# Patient Record
Sex: Female | Born: 1955 | Race: White | Hispanic: No | Marital: Married | Smoking: Never smoker
Health system: Southern US, Community
[De-identification: ages and names within clinical notes are randomized; demographics above are authoritative.]

## PROBLEM LIST (undated history)

## (undated) DIAGNOSIS — K589 Irritable bowel syndrome without diarrhea: Secondary | ICD-10-CM

## (undated) DIAGNOSIS — M810 Age-related osteoporosis without current pathological fracture: Secondary | ICD-10-CM

## (undated) DIAGNOSIS — I341 Nonrheumatic mitral (valve) prolapse: Secondary | ICD-10-CM

## (undated) HISTORY — DX: Irritable bowel syndrome, unspecified: K58.9

## (undated) HISTORY — DX: Age-related osteoporosis without current pathological fracture: M81.0

## (undated) HISTORY — PX: BREAST CYST ASPIRATION: SHX578

---

## 2002-04-18 ENCOUNTER — Ambulatory Visit (HOSPITAL_COMMUNITY): Admission: RE | Admit: 2002-04-18 | Discharge: 2002-04-18 | Payer: Self-pay | Admitting: *Deleted

## 2003-02-23 HISTORY — PX: ABDOMINAL HYSTERECTOMY: SHX81

## 2004-02-29 ENCOUNTER — Emergency Department: Payer: Self-pay | Admitting: Emergency Medicine

## 2004-03-04 ENCOUNTER — Emergency Department: Payer: Self-pay | Admitting: Emergency Medicine

## 2004-03-12 ENCOUNTER — Emergency Department: Payer: Self-pay | Admitting: Emergency Medicine

## 2004-10-07 ENCOUNTER — Ambulatory Visit (HOSPITAL_COMMUNITY): Admission: RE | Admit: 2004-10-07 | Discharge: 2004-10-07 | Payer: Self-pay | Admitting: *Deleted

## 2005-07-29 ENCOUNTER — Ambulatory Visit: Payer: Self-pay | Admitting: Unknown Physician Specialty

## 2005-08-31 ENCOUNTER — Ambulatory Visit: Payer: Self-pay | Admitting: Family Medicine

## 2006-08-18 ENCOUNTER — Ambulatory Visit: Payer: Self-pay | Admitting: Unknown Physician Specialty

## 2007-11-08 ENCOUNTER — Ambulatory Visit: Payer: Self-pay | Admitting: Obstetrics and Gynecology

## 2009-03-18 ENCOUNTER — Ambulatory Visit: Payer: Self-pay | Admitting: Unknown Physician Specialty

## 2010-09-29 ENCOUNTER — Ambulatory Visit: Payer: Self-pay | Admitting: Unknown Physician Specialty

## 2013-04-24 ENCOUNTER — Encounter: Payer: Self-pay | Admitting: General Surgery

## 2013-05-02 ENCOUNTER — Encounter: Payer: Self-pay | Admitting: General Surgery

## 2013-05-02 ENCOUNTER — Ambulatory Visit (INDEPENDENT_AMBULATORY_CARE_PROVIDER_SITE_OTHER): Payer: BC Managed Care – PPO | Admitting: General Surgery

## 2013-05-02 VITALS — BP 122/70 | HR 60 | Resp 12 | Ht 67.0 in | Wt 121.0 lb

## 2013-05-02 DIAGNOSIS — N644 Mastodynia: Secondary | ICD-10-CM

## 2013-05-02 NOTE — Patient Instructions (Signed)
Patient asked to take 2 Aleve by mouth twice daily for 2 weeks. Patient to call with a report to let us know if it helped with the pain. The patient is aware to call back for any questions or concerns. Patient to return as needed.

## 2013-05-02 NOTE — Progress Notes (Signed)
Patient ID: Megan Tapia, female   DOB: 07/31/1955, 58 y.o.   MRN: [id number]  Chief Complaint  Patient presents with  . Follow-up    mammogram    HPI Megan Tapia is a 58 y.o. female who presents for a breast evaluation. The most recent mammogram was done on 01/17/13. Patient does perform regular self breast checks and gets regular mammograms done.  The patient complains of left breast pain that's described as an ache. The pain is constant. She does not use anything to help alleviate the pain. No injuries have occurred in this area.  This started around October 2014. The pain radiates from the nipple to her back. It does not stop her from what she needs to do. No thing makes the pain better or worse. The patient is right-handed, and does not carry a handbag, only a clutch. No history of extreme physical activity/exercise regimen. She is made uses p.r.n. Advil on occasion.  The patient was last seen in this office in February 2009 in regards to mastalgia involving the upper lateral aspect of the left breast. Similar pain had been noted in 2006. In 2000, multiple breast cysts were identified clinical exam and confirmed on office ultrasound.    HPI  Past Medical History  Diagnosis Date  . Osteoporosis   . IBS (irritable bowel syndrome)     Past Surgical History  Procedure Laterality Date  . Abdominal hysterectomy  2005    Family History  Problem Relation Age of Onset  . Cancer Maternal Aunt 5750    breast    Social History History  Substance Use Topics  . Smoking status: Never Smoker   . Smokeless tobacco: Never Used  . Alcohol Use: No    Allergies  Allergen Reactions  . Boniva [Ibandronic Acid] Nausea Only    Current Outpatient Prescriptions  Medication Sig Dispense Refill  . alendronate (FOSAMAX) 70 MG tablet Take 70 mg by mouth once a week. Take with a full glass of water on an empty stomach.      . Calcium Carbonate-Vitamin D (CALCIUM + D PO) Take 2 tablets by  mouth daily.      Marland Kitchen. VITAMIN D, CHOLECALCIFEROL, PO Take 1 tablet by mouth daily.       No current facility-administered medications for this visit.    Review of Systems Review of Systems  Constitutional: Negative.   Respiratory: Negative.   Cardiovascular: Negative.     Blood pressure 122/70, pulse 60, resp. rate 12, height 5\' 7"  (1.702 m), weight 121 lb (54.885 kg).  Physical Exam Physical Exam  Constitutional: She is oriented to person, place, and time. She appears well-developed and well-nourished.  Neck: Neck supple. No thyromegaly present.  Cardiovascular: Normal rate, regular rhythm and normal heart sounds.   No murmur heard. Pulmonary/Chest: Effort normal and breath sounds normal. She exhibits no tenderness, no bony tenderness, no edema, no deformity and no swelling. Right breast exhibits no inverted nipple, no mass, no nipple discharge, no skin change and no tenderness. Left breast exhibits no inverted nipple, no mass, no nipple discharge, no skin change and no tenderness.  Musculoskeletal: Normal range of motion.       Left shoulder: Normal.       Arms: Lymphadenopathy:    She has no cervical adenopathy.    She has no axillary adenopathy.  Neurological: She is alert and oriented to person, place, and time.  Skin: Skin is warm and dry.    Data Reviewed Bilateral  mammogram dated January 17, 2013 completed at [facility] reported dense breasts with stable punctate calcifications. BI-RAD-2  Assessment    .Mastalgia, recurrent.     Plan    The patient has been asked to make use of a trial of Aleve, 2 tablets b.i.d. For 2 weeks and then to give a phone followup.       Earline Mayotte 05/05/2013, 8:11 PM   [facility], M.D.

## 2013-05-05 DIAGNOSIS — N644 Mastodynia: Secondary | ICD-10-CM | POA: Insufficient documentation

## 2013-12-24 ENCOUNTER — Encounter: Payer: Self-pay | Admitting: General Surgery

## 2014-08-15 ENCOUNTER — Other Ambulatory Visit: Payer: Self-pay | Admitting: Unknown Physician Specialty

## 2014-08-15 DIAGNOSIS — Z1231 Encounter for screening mammogram for malignant neoplasm of breast: Secondary | ICD-10-CM

## 2014-08-16 ENCOUNTER — Other Ambulatory Visit: Payer: Self-pay | Admitting: Unknown Physician Specialty

## 2014-08-16 ENCOUNTER — Ambulatory Visit
Admission: RE | Admit: 2014-08-16 | Discharge: 2014-08-16 | Disposition: A | Discharge status: Home / Self Care | Payer: BC Managed Care – PPO | Source: Ambulatory Visit | Attending: Unknown Physician Specialty | Admitting: Unknown Physician Specialty

## 2014-08-16 DIAGNOSIS — Z1231 Encounter for screening mammogram for malignant neoplasm of breast: Secondary | ICD-10-CM | POA: Diagnosis present

## 2015-07-23 ENCOUNTER — Emergency Department: Payer: BC Managed Care – PPO

## 2015-07-23 ENCOUNTER — Emergency Department
Admission: EM | Admit: 2015-07-23 | Discharge: 2015-07-24 | Disposition: A | Discharge status: Home / Self Care | Payer: BC Managed Care – PPO | Attending: Emergency Medicine | Admitting: Emergency Medicine

## 2015-07-23 DIAGNOSIS — R110 Nausea: Secondary | ICD-10-CM | POA: Diagnosis not present

## 2015-07-23 DIAGNOSIS — R519 Headache, unspecified: Secondary | ICD-10-CM

## 2015-07-23 DIAGNOSIS — R51 Headache: Secondary | ICD-10-CM | POA: Diagnosis present

## 2015-07-23 HISTORY — DX: Nonrheumatic mitral (valve) prolapse: I34.1

## 2015-07-23 LAB — CBC WITH DIFFERENTIAL/PLATELET
Basophils Absolute: 0 10*3/uL (ref 0–0.1)
EOS ABS: 0 10*3/uL (ref 0–0.7)
HCT: 41.4 % (ref 35.0–47.0)
Hemoglobin: 14.2 g/dL (ref 12.0–16.0)
LYMPHS ABS: 1.1 10*3/uL (ref 1.0–3.6)
Lymphocytes Relative: 16 %
MCH: 30 pg (ref 26.0–34.0)
MCHC: 34.2 g/dL (ref 32.0–36.0)
MCV: 87.7 fL (ref 80.0–100.0)
MONO ABS: 0.3 10*3/uL (ref 0.2–0.9)
Neutro Abs: 5.5 10*3/uL (ref 1.4–6.5)
Neutrophils Relative %: 78 %
PLATELETS: 217 10*3/uL (ref 150–440)
RBC: 4.72 MIL/uL (ref 3.80–5.20)
RDW: 13.1 % (ref 11.5–14.5)
WBC: 6.9 10*3/uL (ref 3.6–11.0)

## 2015-07-23 LAB — MONONUCLEOSIS SCREEN: Mono Screen: NEGATIVE

## 2015-07-23 LAB — URINALYSIS COMPLETE WITH MICROSCOPIC (ARMC ONLY)
BACTERIA UA: NONE SEEN
BILIRUBIN URINE: NEGATIVE
Glucose, UA: 150 mg/dL — AB
HGB URINE DIPSTICK: NEGATIVE
LEUKOCYTES UA: NEGATIVE
NITRITE: NEGATIVE
PH: 8 (ref 5.0–8.0)
Protein, ur: 30 mg/dL — AB
Specific Gravity, Urine: 1.012 (ref 1.005–1.030)
Squamous Epithelial / LPF: NONE SEEN

## 2015-07-23 LAB — COMPREHENSIVE METABOLIC PANEL
ALK PHOS: 49 U/L (ref 38–126)
ALT: 25 U/L (ref 14–54)
ANION GAP: 16 — AB (ref 5–15)
AST: 43 U/L — ABNORMAL HIGH (ref 15–41)
Albumin: 4.7 g/dL (ref 3.5–5.0)
BUN: 11 mg/dL (ref 6–20)
CALCIUM: 9.4 mg/dL (ref 8.9–10.3)
CO2: 17 mmol/L — AB (ref 22–32)
Chloride: 102 mmol/L (ref 101–111)
Creatinine, Ser: 0.82 mg/dL (ref 0.44–1.00)
GFR calc non Af Amer: >60 mL/min (ref >60)
Glucose, Bld: 171 mg/dL — ABNORMAL HIGH (ref 65–99)
POTASSIUM: 2.7 mmol/L — AB (ref 3.5–5.1)
SODIUM: 135 mmol/L (ref 135–145)
Total Bilirubin: 0.8 mg/dL (ref 0.3–1.2)
Total Protein: 7.5 g/dL (ref 6.5–8.1)

## 2015-07-23 MED ORDER — DOXYCYCLINE HYCLATE 100 MG PO TBEC: 100.0000 mg | DELAYED_RELEASE_TABLET | Freq: Two times a day (BID) | ORAL | Status: AC

## 2015-07-23 MED ORDER — HYDROMORPHONE HCL 1 MG/ML IJ SOLN: 1.0000 mg | Freq: Once | INTRAMUSCULAR | Status: DC

## 2015-07-23 MED ORDER — LORAZEPAM 2 MG/ML IJ SOLN
2.0000 mg | Freq: Once | INTRAMUSCULAR | Status: AC
  Administered 2015-07-23: 2 mg via INTRAVENOUS
  Filled 2015-07-23: qty 1

## 2015-07-23 MED ORDER — POTASSIUM CHLORIDE ER 20 MEQ PO TBCR: 20.0000 meq | EXTENDED_RELEASE_TABLET | Freq: Every day | ORAL | Status: DC

## 2015-07-23 MED ORDER — DOXYCYCLINE HYCLATE 100 MG PO TABS
100.0000 mg | ORAL_TABLET | Freq: Two times a day (BID) | ORAL | Status: DC
  Administered 2015-07-23: 100 mg via ORAL
  Filled 2015-07-23: qty 1

## 2015-07-23 MED ORDER — POTASSIUM CHLORIDE CRYS ER 20 MEQ PO TBCR
20.0000 meq | EXTENDED_RELEASE_TABLET | Freq: Once | ORAL | Status: AC
  Administered 2015-07-23: 20 meq via ORAL
  Filled 2015-07-23: qty 1

## 2015-07-23 MED ORDER — ONDANSETRON HCL 4 MG PO TABS: 4.0000 mg | ORAL_TABLET | Freq: Three times a day (TID) | ORAL | Status: AC | PRN

## 2015-07-23 MED ORDER — ONDANSETRON HCL 4 MG/2ML IJ SOLN
4.0000 mg | Freq: Once | INTRAMUSCULAR | Status: AC
  Administered 2015-07-23: 4 mg via INTRAVENOUS
  Filled 2015-07-23 (×2): qty 2

## 2015-07-23 NOTE — Discharge Instructions (Signed)
General Headache Without Cause °A headache is pain or discomfort felt around the head or neck area. There are many causes and types of headaches. In some cases, the cause may not be found.  °HOME CARE  °Managing Pain °· Take over-the-counter and prescription medicines only as told by your doctor. °· Lie down in a dark, quiet room when you have a headache. °· If directed, apply ice to the head and neck area: °¨ Put ice in a plastic bag. °¨ Place a towel between your skin and the bag. °¨ Leave the ice on for 20 minutes, 2-3 times per day. °· Use a heating pad or hot shower to apply heat to the head and neck area as told by your doctor. °· Keep lights dim if bright lights bother you or make your headaches worse. °Eating and Drinking °· Eat meals on a regular schedule. °· Lessen how much alcohol you drink. °· Lessen how much caffeine you drink, or stop drinking caffeine. °General Instructions °· Keep all follow-up visits as told by your doctor. This is important. °· Keep a journal to find out if certain things bring on headaches. For example, write down: °¨ What you eat and drink. °¨ How much sleep you get. °¨ Any change to your diet or medicines. °· Relax by getting a massage or doing other relaxing activities. °· Lessen stress. °· Sit up straight. Do not tighten (tense) your muscles. °· Do not use tobacco products. This includes cigarettes, chewing tobacco, or e-cigarettes. If you need help quitting, ask your doctor. °· Exercise regularly as told by your doctor. °· Get enough sleep. This often means 7-9 hours of sleep. °GET HELP IF: °· Your symptoms are not helped by medicine. °· You have a headache that feels different than the other headaches. °· You feel sick to your stomach (nauseous) or you throw up (vomit). °· You have a fever. °GET HELP RIGHT AWAY IF:  °· Your headache becomes really bad. °· You keep throwing up. °· You have a stiff neck. °· You have trouble seeing. °· You have trouble speaking. °· You have  pain in the eye or ear. °· Your muscles are weak or you lose muscle control. °· You lose your balance or have trouble walking. °· You feel like you will pass out (faint) or you pass out. °· You have confusion. °  °This information is not intended to replace advice given to you by your health care provider. Make sure you discuss any questions you have with your health care provider. °  °Document Released: 11/18/2007 Document Revised: 10/30/2014 Document Reviewed: 06/03/2014 °Elsevier Interactive Patient Education ©2016 [employer]vier [employer] ° °Please return immediately if condition worsens. Please contact her primary physician or the physician you were given for referral. If you have any specialist physicians involved in her treatment and plan please also contact them. Thank you for using [facility]. ° °

## 2015-07-23 NOTE — ED Notes (Signed)
Pt arrives to ED via [facility] for headache and nausea that began today. Headache began this morning, nausea began around 6p. Pt has red bumps/bites on L chest, has had them on, feet x 1 month. Pt states hands and feet are numb. Pt able to answer questions but in short phrases at this time. Pt able to follow commands, neuro exam unremarkable. O2 applied for numbness in hands and feet. Per husband, they live with a lot of trees, they have called mosquito squad to come out to spray. They also have fumigated their daughters room because they noticed bugs in her room a few months ago. Husband has not had any bumps/bites on her. Pt visited a dermatologist today, received zyrtec, HA medicine. Pt also took a family members zofran PTA. En route EMS gave 4mg  zofran. Pt also c/o of soreness in shoulders, pt able to turn head side to side. Hx of mitral valve prolapse.

## 2015-07-23 NOTE — ED Provider Notes (Signed)
Time Seen: Approximately *20/50 I have reviewed the triage notes  Chief Complaint: Headache and Nausea   History of Present Illness: Megan Tapia is a 60 y.o. female who presents via EMS after starting with a headache today along with some nausea. Patient was seen and evaluated recently by a dermatologist for some red bumps and bites and been on the feet and hands extremities abdomen and face etc. No obvious source for these problems. Patient was placed on 0 tach. Patient to also is been exposed to mononucleosis. The patient developed with nausea vomiting and dry heaves some upper extremity cramping and hyperventilation. He was transported by EMS and had Zofran prior to arrival. She denies any neck pain or stiffness, photophobia, focal weakness in either upper or lower extremities.   Past Medical History  Diagnosis Date  . Osteoporosis   . IBS (irritable bowel syndrome)   . Mitral valve prolapse     Patient Active Problem List   Diagnosis Date Noted  . Breast pain 05/05/2013    Past Surgical History  Procedure Laterality Date  . Abdominal hysterectomy  2005  . Breast cyst aspiration Left     years ago as per pt.    Past Surgical History  Procedure Laterality Date  . Abdominal hysterectomy  2005  . Breast cyst aspiration Left     years ago as per pt.    Current Outpatient Rx  Name  Route  Sig  Dispense  Refill  . acetaminophen (TYLENOL) 500 MG tablet   Oral   Take 1,000 mg by mouth every 6 (six) hours as needed for mild pain or headache.         . alendronate (FOSAMAX) 70 MG tablet   Oral   Take 70 mg by mouth once a week. Pt takes on Sunday.   Take with a full glass of water on an empty stomach.         . Calcium Carbonate-Vitamin D (CALCIUM 600+D) 600-400 MG-UNIT tablet   Oral   Take 2 tablets by mouth daily.         . cetirizine (ZYRTEC) 10 MG tablet   Oral   Take 10 mg by mouth daily.         . cholecalciferol (VITAMIN D) 1000 units tablet    Oral   Take 1,000 Units by mouth daily.           Allergies:  Boniva  Family History: Family History  Problem Relation Age of Onset  . Cancer Maternal Aunt 50    breast  . Breast cancer Maternal Aunt     Social History: Social History  Substance Use Topics  . Smoking status: Never Smoker   . Smokeless tobacco: Never Used  . Alcohol Use: No     Review of Systems:   10 point review of systems was performed and was otherwise negative:  Constitutional: No fever Eyes: No visual disturbances ENT: No sore throat, ear pain Cardiac: No chest pain Respiratory: No shortness of breath, wheezing, or stridor Abdomen: No abdominal pain, no vomiting, No diarrhea Endocrine: No weight loss, No night sweats Extremities: No peripheral edema, cyanosis Skin: No rashes, easy bruising Neurologic: No focal weakness, trouble with speech or swollowing Urologic: No dysuria, Hematuria, or urinary frequency *Patient's exposed to take environments but has not noticed any actual tick bite at this time.  Physical Exam:  ED Triage Vitals  Enc Vitals Group     BP 07/23/15 2023 135/77 mmHg  Pulse Rate 07/23/15 2023 91     Resp 07/23/15 2023 29     Temp 07/23/15 2023 97.7 F (36.5 C)     Temp Source 07/23/15 2023 Oral     SpO2 07/23/15 2023 100 %     Weight --      Height --      Head Cir --      Peak Flow --      Pain Score 07/23/15 2023 8     Pain Loc --      Pain Edu? --      Excl. in GC? --     General: Awake , Alert , and Oriented times 3; GCS 15. Anxious, cramps in both the upper and lower extremities Head: Normal cephalic , atraumatic Eyes: Pupils equal , round, reactive to light Nose/Throat: No nasal drainage, patent upper airway without erythema or exudate.  Neck: Supple, Full range of motion, No anterior adenopathy or palpable thyroid masses Lungs: Clear to ascultation without wheezes , rhonchi, or rales Heart: Regular rate, regular rhythm without murmurs , gallops , or  rubs Abdomen: Soft, non tender without rebound, guarding , or rigidity; bowel sounds positive and symmetric in all 4 quadrants. No organomegaly .        Extremities: 2 plus symmetric pulses. No edema, clubbing or cyanosis Neurologic: normal ambulation, Motor symmetric without deficits, sensory intact Skin: Diffuse punctate red raised lesions noticed on her left elbow and left side of her face. No target shaped lesion, petechiae or purpura   Labs:   All laboratory work was reviewed including any pertinent negatives or positives listed below:  Labs Reviewed  COMPREHENSIVE METABOLIC PANEL - Abnormal; Notable for the following:    Potassium 2.7 (*)    CO2 17 (*)    Glucose, Bld 171 (*)    AST 43 (*)    Anion gap 16 (*)    All other components within normal limits  URINALYSIS COMPLETEWITH MICROSCOPIC ([facility] ONLY) - Abnormal; Notable for the following:    Color, Urine STRAW (*)    APPearance CLEAR (*)    Glucose, UA 150 (*)    Ketones, ur 1+ (*)    Protein, ur 30 (*)    All other components within normal limits  CBC WITH DIFFERENTIAL/PLATELET  MONONUCLEOSIS SCREEN  Patient's potassium is low but otherwise her workup was within normal limits laboratory work.  EKG:  ED ECG REPORT I, Jennye MoccasinBrian S Ontario Pettengill, the attending physician, personally viewed and interpreted this ECG.  Date: 07/23/2015 EKG Time: 2018 Rate: 106 Rhythm: Sinus tachycardia QRS Axis: normal Intervals: normal ST/T Wave abnormalities: Nonspecific ST-T wave abnormalities Conduction Disturbances: none Narrative Interpretation: unremarkable No acute ischemic changes noted   Radiology:   Final result by Rad Results In Interface (07/23/15 21:11:58)   Narrative:   CLINICAL DATA: Headache and nausea, onset today.  EXAM: CT HEAD WITHOUT CONTRAST  TECHNIQUE: Contiguous axial images were obtained from the base of the skull through the vertex without intravenous contrast.  COMPARISON: None.  FINDINGS: Mild  generalized cerebral atrophy. No intracranial hemorrhage, mass effect, or midline shift. No hydrocephalus. The basilar cisterns are patent. No evidence of territorial infarct. No intracranial fluid collection. Calvarium is intact. Included paranasal sinuses and mastoid air cells are well aerated.  IMPRESSION: 1. No acute intracranial abnormality. 2. Mild cerebral atrophy.   Electronically Signed By: Rubye OaksMelanie Ehinger M.D. On: 07/23/2015 21:11      I personally reviewed the radiologic studies     ED  Course: * Patient's stay here was uneventful and patient's remained hemodynamically stable. She had symptomatic improvement with Ativan and Zofran. Her headache seems to resolve with the above-mentioned medications. I discussed with her performing a spinal tap to complete the evaluation for acute cephalgia the patient electively declines at this time. I stated that I had a low clinical suspicion for an aneurysm at this juncture based on her presenting symptoms. The patient does have a diffuse rash that almost has a purulent type sound to it like a staph or strep infection. There is also the concern for tick bite and tick born disease. I we will prescribe the patient doxycycline from the emergency department. She will also receive a prescription for Zofran and is advised to take over-the-counter pain medication. She has low potassium will be prescribed some potassium supplement therapy    Assessment:  Acute unspecified cephalgia Hypokalemia Anxiety Tick exposure Unspecified rash     Plan:  Outpatient Patient was advised to return immediately if condition worsens. Patient was advised to follow up with their primary care physician or other specialized physicians involved in their outpatient care. The patient and/or family member/power of attorney had laboratory results reviewed at the bedside. All questions and concerns were addressed and appropriate discharge instructions were  distributed by the nursing staff.             Jennye Moccasin, MD 07/23/15 [id number]

## 2015-09-05 ENCOUNTER — Other Ambulatory Visit: Payer: Self-pay | Admitting: Internal Medicine

## 2015-09-05 DIAGNOSIS — Z1231 Encounter for screening mammogram for malignant neoplasm of breast: Secondary | ICD-10-CM

## 2015-09-10 ENCOUNTER — Ambulatory Visit
Admission: RE | Admit: 2015-09-10 | Discharge: 2015-09-10 | Disposition: A | Discharge status: Home / Self Care | Payer: BC Managed Care – PPO | Source: Ambulatory Visit | Attending: Internal Medicine | Admitting: Internal Medicine

## 2015-09-10 ENCOUNTER — Other Ambulatory Visit: Payer: Self-pay | Admitting: Internal Medicine

## 2015-09-10 DIAGNOSIS — Z1231 Encounter for screening mammogram for malignant neoplasm of breast: Secondary | ICD-10-CM | POA: Diagnosis not present

## 2015-09-22 ENCOUNTER — Other Ambulatory Visit: Payer: Self-pay | Admitting: Internal Medicine

## 2015-09-22 DIAGNOSIS — G9340 Encephalopathy, unspecified: Secondary | ICD-10-CM

## 2015-09-26 ENCOUNTER — Ambulatory Visit
Admission: RE | Admit: 2015-09-26 | Discharge: 2015-09-26 | Disposition: A | Discharge status: Home / Self Care | Payer: BC Managed Care – PPO | Source: Ambulatory Visit | Attending: Internal Medicine | Admitting: Internal Medicine

## 2015-09-26 DIAGNOSIS — G9340 Encephalopathy, unspecified: Secondary | ICD-10-CM | POA: Diagnosis present

## 2015-09-26 MED ORDER — GADOBENATE DIMEGLUMINE 529 MG/ML IV SOLN
10.0000 mL | Freq: Once | INTRAVENOUS | Status: AC | PRN
  Administered 2015-09-26: 10 mL via INTRAVENOUS

## 2015-10-02 ENCOUNTER — Ambulatory Visit
Admission: RE | Admit: 2015-10-02 | Discharge: 2015-10-02 | Disposition: A | Discharge status: Home / Self Care | Payer: BC Managed Care – PPO | Source: Ambulatory Visit | Attending: Internal Medicine | Admitting: Internal Medicine

## 2016-02-20 ENCOUNTER — Ambulatory Visit
Admission: RE | Admit: 2016-02-20 | Discharge: 2016-02-20 | Disposition: A | Discharge status: Home / Self Care | Payer: BC Managed Care – PPO | Source: Ambulatory Visit | Attending: Internal Medicine | Admitting: Internal Medicine

## 2016-02-20 ENCOUNTER — Other Ambulatory Visit: Payer: Self-pay | Admitting: Internal Medicine

## 2016-02-20 DIAGNOSIS — Z90710 Acquired absence of both cervix and uterus: Secondary | ICD-10-CM | POA: Diagnosis not present

## 2016-02-20 DIAGNOSIS — R938 Abnormal findings on diagnostic imaging of other specified body structures: Secondary | ICD-10-CM | POA: Diagnosis not present

## 2016-02-20 DIAGNOSIS — R1084 Generalized abdominal pain: Secondary | ICD-10-CM | POA: Diagnosis not present

## 2016-02-20 MED ORDER — IOPAMIDOL (ISOVUE-300) INJECTION 61%
100.0000 mL | Freq: Once | INTRAVENOUS | Status: AC | PRN
  Administered 2016-02-20: 100 mL via INTRAVENOUS

## 2016-09-13 ENCOUNTER — Encounter: Payer: Self-pay | Admitting: Physician Assistant

## 2016-09-16 ENCOUNTER — Other Ambulatory Visit (INDEPENDENT_AMBULATORY_CARE_PROVIDER_SITE_OTHER): Payer: BC Managed Care – PPO

## 2016-09-16 ENCOUNTER — Encounter: Payer: Self-pay | Admitting: Neurology

## 2016-09-16 ENCOUNTER — Encounter: Payer: Self-pay | Admitting: Physician Assistant

## 2016-09-16 ENCOUNTER — Ambulatory Visit (INDEPENDENT_AMBULATORY_CARE_PROVIDER_SITE_OTHER): Payer: BC Managed Care – PPO | Admitting: Physician Assistant

## 2016-09-16 ENCOUNTER — Telehealth: Payer: Self-pay | Admitting: *Deleted

## 2016-09-16 ENCOUNTER — Encounter (INDEPENDENT_AMBULATORY_CARE_PROVIDER_SITE_OTHER): Payer: Self-pay

## 2016-09-16 VITALS — BP 114/80 | HR 74 | Ht 66.0 in | Wt 133.0 lb

## 2016-09-16 DIAGNOSIS — L500 Allergic urticaria: Secondary | ICD-10-CM | POA: Diagnosis not present

## 2016-09-16 DIAGNOSIS — R110 Nausea: Secondary | ICD-10-CM

## 2016-09-16 LAB — HIGH SENSITIVITY CRP: CRP HIGH SENSITIVITY: 1.02 mg/L (ref 0.000–5.000)

## 2016-09-16 NOTE — Patient Instructions (Addendum)
Please go to the basement level to have your labs drawn.  We are making a referral to [facility]. You will be called with an appointment. The next available with Dr. Tomi Likens is 12-29-2016 at 9:45 am.  They do have you on a cancellation list.   Take Zofran ODT as needed.   Get a home glucose monitoring kit. Check glucose level when having migraine episodes.

## 2016-09-16 NOTE — Progress Notes (Addendum)
Subjective:    Patient ID: Megan Tapia, female    DOB: [birth year], 61 y.o.   MRN: [id number]  HPI Megan Tapia is a 61 year old white female, new to GI today referred by Dr. Elta Guadeloupe [facility] for evaluation of recurrent episodes of what she describes as extreme nausea and gnawing significant hunger which have been occurring over the past year or so. There is concern that these may have an underlying hormonal type trigger. Patient says she has had prior colonoscopies and these were done per Dr. Candace Cruise at current clinic. She does not recall whether she had colon polyps. She's otherwise been in fairly good health but has had problems intermittently with headaches. She says her current episode started in May 2017 at which time she developed a significant headache followed by severe nausea, gnawing hunger type feeling and lightheadedness. She says she typically vomits with these episodes and is unable to eat for a couple of hours, symptoms gradually resolve and then she craves carbs. She had an episode in May, again in July, December 2017 then June 2018 and July 2018. She says most of these episodes last for 3-4 hours and are followed by fatigue and weakness. Again she says despite feeling very hungry she's also quite nauseated and is unable to eat until symptoms start to resolve. She does have some Zofran to use this point and says that has helped somewhat. Most of these episodes have been associated with headache at onset of symptoms. She has noted some tingling sensation and shakiness with the episodes, no diarrhea, no diaphoresis or flushing that she is aware of. She has had ER evaluation with a couple of these episodes and says that all of her labs have been normal including her glucoses. She is unaware of any specific triggers. In between these episodes she feels fine and denies any ongoing GI symptoms. She says she's been getting occasional dull headaches. Recent evaluation was CBC and chemistries  unremarkable, CT of the abdomen and pelvis was negative and MRI of the brain negative. She has been referred to endocrinology and is awaiting that appointment. Family history negative for GI disease. She also relates having chronic intermittent problems with urticaria over the past year or so. She is currently on a regimen of Zyrtec, Singulair and doxepin which is controlling the urticaria. She does not have any specific association with the episodes of urticaria occurring in relation to her other symptoms with headache nausea ,hunger etc.  Review of Systems Pertinent positive and negative review of systems were noted in the above HPI section.  All other review of systems was otherwise negative.  Outpatient Encounter Prescriptions as of 09/16/2016  Medication Sig  . acetaminophen (TYLENOL) 500 MG tablet Take 1,000 mg by mouth every 6 (six) hours as needed for mild pain or headache.  . cetirizine (ZYRTEC) 10 MG tablet Take 10 mg by mouth daily.  . ondansetron (ZOFRAN) 4 MG tablet Take 1 tablet (4 mg total) by mouth every 8 (eight) hours as needed for nausea or vomiting.  . potassium chloride 20 MEQ TBCR Take 20 mEq by mouth daily.  . [DISCONTINUED] alendronate (FOSAMAX) 70 MG tablet Take 70 mg by mouth once a week. Pt takes on Sunday.   Take with a full glass of water on an empty stomach.  . [DISCONTINUED] Calcium Carbonate-Vitamin D (CALCIUM 600+D) 600-400 MG-UNIT tablet Take 2 tablets by mouth daily.  . [DISCONTINUED] cholecalciferol (VITAMIN D) 1000 units tablet Take 1,000 Units by mouth daily.  No facility-administered encounter medications on file as of 09/16/2016.    Allergies  Allergen Reactions  . Boniva [Ibandronic Acid] Nausea And Vomiting and Other (See Comments)    Reaction:  Muscle pain    Patient Active Problem List   Diagnosis Date Noted  . Allergic urticaria 09/16/2016  . Breast pain 05/05/2013   Social History   Social History  . Marital status: Married    Spouse name:  [name]  . Number of children: [name]  . Years of education: [name]   Occupational History  . Not on file.   Social History Main Topics  . Smoking status: Never Smoker  . Smokeless tobacco: Never Used  . Alcohol use No  . Drug use: No  . Sexual activity: Not on file   Other Topics Concern  . Not on file   Social History Narrative  . No narrative on file    Ms. Demeritt family history includes Breast cancer in her maternal aunt; Cancer (age of onset: 62) in her maternal aunt.      Objective:    Vitals:   09/16/16 0903  BP: 114/80  Pulse: 74    Physical Exam well-developed older white female in no acute distress, pleasant blood pressure 114/80 pulse 74, height 5 foot 6, weight 133, BMI 21.4. HEENT; nontraumatic, cephalic EOMI PERRLA sclera anicteric, Cardiovascular; regular rate and rhythm with S1-S2 no murmur or gallop, Pulmonary ;clear bilaterally, Abdomen; soft nontender nondistended bowel sounds are active there is no palpable mass or hepatosplenomegaly Rectal ;exam not done, Extremities; no clubbing cyanosis or edema no skin lesions evident or urticaria, Neuropsych ;mood and affect appropriate       Assessment & Plan:   #74 61 year old white female with recurrent episodes of symptom complex involving headache, severe nausea gnawing hunger sensation with vomiting and lightheadedness. Patient has had 5-6 episodes over the past year all lasting 4-5 hours with gradual resolution and no interval GI symptoms. I  do not think that her symptoms have an underlying GI etiology. I'm more concerned that she is having migraines or a migraine variant. Symptoms are also consistent with possible  reactive hypoglycemia though she says that her glucoses have been normal with ER evaluations. #2 colon cancer surveillance-patient has had prior colonoscopy will obtain records #3 chronic intermittent urticaria Plan; I think workup with endocrinology is appropriate. We have also referred to [facility]   neurology because of concern for migraine last atypical migraine I've asked her to get a home glucose monitoring kit and check her blood sugar with onset of her next episode We'll check CRP, sedimentation rate, A NA, gastrin, Have requested her previous records from [place]  [facility]/Dr. Oh Patient will be established with Dr. Scarlette Shorts  Addendum; records reviewed from Dr. Eddie Dibbles Oh-colonoscopy June 2014 normal exam. Colonoscopy March 2009 Dr. Lelan Pons. Patient has family history of colon polyps EGD 2006 Dr. Vladimir Faster- normal  Jaquala Fuller S Summerlynn Glauser PA-C 09/16/2016   Cc: Rusty Aus, MD

## 2016-09-16 NOTE — Telephone Encounter (Signed)
Called the patient to advise we made her an appointment at [facility] with Dr. Everlena CooperJaffe.  Her appointment is 12-29-2016 at 9:45 am.  I did let her know they have her on a cancellation list.  I gave her the address and phone number and suggested she call them now and then to see if they have a sooner appointment for her.  She thanked me for calling.

## 2016-09-17 ENCOUNTER — Other Ambulatory Visit: Payer: BC Managed Care – PPO

## 2016-09-17 DIAGNOSIS — R110 Nausea: Secondary | ICD-10-CM

## 2016-09-17 LAB — ANA: Anti Nuclear Antibody(ANA): NEGATIVE

## 2016-09-17 NOTE — Progress Notes (Signed)
Assessment and plans reviewed. Assessment of non-GI issues or questions should be with appropriate non-GI physicians. Discussed with GI PA who will follow-up on the ordered studies

## 2016-09-20 LAB — GASTRIN: Gastrin: 18 pg/mL (ref <=100)

## 2016-09-21 ENCOUNTER — Other Ambulatory Visit: Payer: Self-pay

## 2016-09-21 DIAGNOSIS — E162 Hypoglycemia, unspecified: Secondary | ICD-10-CM

## 2016-10-13 ENCOUNTER — Other Ambulatory Visit: Payer: Self-pay | Admitting: Internal Medicine

## 2016-10-13 DIAGNOSIS — Z1231 Encounter for screening mammogram for malignant neoplasm of breast: Secondary | ICD-10-CM

## 2016-10-28 ENCOUNTER — Ambulatory Visit
Admission: RE | Admit: 2016-10-28 | Discharge: 2016-10-28 | Disposition: A | Discharge status: Home / Self Care | Payer: BC Managed Care – PPO | Source: Ambulatory Visit | Attending: Internal Medicine | Admitting: Internal Medicine

## 2016-10-28 DIAGNOSIS — Z1231 Encounter for screening mammogram for malignant neoplasm of breast: Secondary | ICD-10-CM | POA: Diagnosis not present

## 2016-10-29 ENCOUNTER — Telehealth: Payer: Self-pay | Admitting: Obstetrics and Gynecology

## 2016-10-29 ENCOUNTER — Other Ambulatory Visit: Payer: Self-pay | Admitting: Obstetrics and Gynecology

## 2016-10-29 DIAGNOSIS — N6320 Unspecified lump in the left breast, unspecified quadrant: Secondary | ICD-10-CM

## 2016-10-29 DIAGNOSIS — R928 Other abnormal and inconclusive findings on diagnostic imaging of breast: Secondary | ICD-10-CM

## 2016-10-29 NOTE — Telephone Encounter (Signed)
Please call.

## 2016-10-29 NOTE — Telephone Encounter (Signed)
Pt is calling back due to missed call for results. Please advise. Best time to reach patient is after 3pm.

## 2016-11-01 ENCOUNTER — Other Ambulatory Visit: Payer: Self-pay | Admitting: Internal Medicine

## 2016-11-01 DIAGNOSIS — R928 Other abnormal and inconclusive findings on diagnostic imaging of breast: Secondary | ICD-10-CM

## 2016-11-01 DIAGNOSIS — N6320 Unspecified lump in the left breast, unspecified quadrant: Secondary | ICD-10-CM

## 2016-11-04 ENCOUNTER — Ambulatory Visit
Admission: RE | Admit: 2016-11-04 | Discharge: 2016-11-04 | Disposition: A | Discharge status: Home / Self Care | Payer: BC Managed Care – PPO | Source: Ambulatory Visit | Attending: Internal Medicine | Admitting: Internal Medicine

## 2016-11-04 DIAGNOSIS — R928 Other abnormal and inconclusive findings on diagnostic imaging of breast: Secondary | ICD-10-CM

## 2016-11-04 DIAGNOSIS — N6320 Unspecified lump in the left breast, unspecified quadrant: Secondary | ICD-10-CM

## 2016-12-29 ENCOUNTER — Encounter: Payer: Self-pay | Admitting: Neurology

## 2016-12-29 ENCOUNTER — Ambulatory Visit: Payer: BC Managed Care – PPO | Admitting: Neurology

## 2016-12-29 VITALS — BP 140/82 | HR 76 | Ht 66.0 in | Wt 135.8 lb

## 2016-12-29 DIAGNOSIS — G43D0 Abdominal migraine, not intractable: Secondary | ICD-10-CM

## 2016-12-29 DIAGNOSIS — G43009 Migraine without aura, not intractable, without status migrainosus: Secondary | ICD-10-CM

## 2016-12-29 MED ORDER — NAPROXEN 500 MG PO TABS: 500.0000 mg | ORAL_TABLET | Freq: Two times a day (BID) | ORAL | 2 refills | Status: AC | PRN

## 2016-12-29 MED ORDER — SUMATRIPTAN SUCCINATE 50 MG PO TABS: ORAL_TABLET | ORAL | 2 refills | Status: AC

## 2016-12-29 MED ORDER — AMITRIPTYLINE HCL 10 MG PO TABS: 20.0000 mg | ORAL_TABLET | Freq: Every day | ORAL | 2 refills | Status: DC

## 2016-12-29 NOTE — Patient Instructions (Signed)
1.  Increase amitriptyline to 20mg  at bedtime.  If not helpful, contact me in 4 to 6 weeks and we can increase dose.  Otherwise, just refill current prescription. 2.  Take sumatriptan 50mg  earliest onset of migraine.  If not effective, may repeat dose once with naproxen 500mg  in two hours if needed. 3.  Follow up in 3 months.

## 2016-12-29 NOTE — Progress Notes (Signed)
NEUROLOGY CONSULTATION NOTE  Megan Tapia MRN: [id number] DOB: 1955/12/07  Referring provider: Mike GipAmy Esterwood, PA-C Primary care provider: Bethann PunchesMark Miller, MD  Reason for consult:  migraines  HISTORY OF PRESENT ILLNESS: Megan Tapia is a 61 year old right-handed female who presents for evaluation of migraine.  She is accompanied by her husband who supplements history.  CT and MRI of head personally reviewed.  In April 2017, she had a sudden onset episode of severe bi-frontal headache associated with hunger, nausea, and vomiting, agitation and paresthesias.  Headache lasted until she went to sleep, followed by two days of extreme lethargy.  She then had recurrent attacks in May, July and December.  She underwent a workup that included normal head CT on 07/23/15, normal MRI of brain with and without contrast on 09/26/15 and CT abdomen and pelvis on 02/20/16 demonstrating large stool burden but otherwise unremarkable.  Around this same time, she developed hives and was taking benadryl and anti-itch cream.  She was doing well for the next 6 months but then had a recurrence in June 2018, followed by July and in August.  Since August, she hasn't had any severe headache, nausea or vomiting but she does have a headache with associated lethargy and paresthesias every weekend.  She was started on amitriptyline 10mg  at bedtime in September.  She has been taking sumatriptan 5mg  with or without generic Excedrin, which helps.  She has Zofran but hasn't needed to take it.  She has history of headaches.  Her sister has headaches.  PAST MEDICAL HISTORY: Past Medical History:  Diagnosis Date  . IBS (irritable bowel syndrome)   . Mitral valve prolapse   . Osteoporosis     PAST SURGICAL HISTORY: Past Surgical History:  Procedure Laterality Date  . ABDOMINAL HYSTERECTOMY  2005  . BREAST CYST ASPIRATION Left    years ago as per pt.    MEDICATIONS: Current Outpatient Medications on File Prior to Visit    Medication Sig Dispense Refill  . levocetirizine (XYZAL) 5 MG tablet Take 5 mg every evening by mouth.    . montelukast (SINGULAIR) 10 MG tablet Take 10 mg 2 (two) times daily by mouth.    Marland Kitchen. acetaminophen (TYLENOL) 500 MG tablet Take 1,000 mg by mouth every 6 (six) hours as needed for mild pain or headache.    . cetirizine (ZYRTEC) 10 MG tablet Take 10 mg by mouth daily.    Marland Kitchen. escitalopram (LEXAPRO) 5 MG tablet Take 5 mg daily by mouth.  3  . ondansetron (ZOFRAN) 4 MG tablet Take 1 tablet (4 mg total) by mouth every 8 (eight) hours as needed for nausea or vomiting. 21 tablet 0  . potassium chloride 20 MEQ TBCR Take 20 mEq by mouth daily. 5 tablet 0   No current facility-administered medications on file prior to visit.     ALLERGIES: Allergies  Allergen Reactions  . Boniva [Ibandronic Acid] Nausea And Vomiting and Other (See Comments)    Reaction:  Muscle pain     FAMILY HISTORY: Family History  Problem Relation Age of Onset  . Cancer Maternal Aunt 50       breast  . Breast cancer Maternal Aunt   . Deep vein thrombosis Mother   . Heart disease Mother   . Mitral valve prolapse Mother   . COPD Mother   . CAD Father        died at age 59    SOCIAL HISTORY: Social History   Socioeconomic History  .  Marital status: Married    Spouse name: Daron OfferCliff  . Number of children: 3  . Years of education: Not on file  . Highest education level: Master's degree (e.g., MA, MS, MEng, MEd, MSW, MBA)  Social Needs  . Financial resource strain: Not on file  . Food insecurity - worry: Not on file  . Food insecurity - inability: Not on file  . Transportation needs - medical: Not on file  . Transportation needs - non-medical: Not on file  Occupational History  . Occupation: Comptrollerlibrarian  Tobacco Use  . Smoking status: Never Smoker  . Smokeless tobacco: Never Used  Substance and Sexual Activity  . Alcohol use: No  . Drug use: No  . Sexual activity: Not on file  Other Topics Concern  . Not  on file  Social History Narrative   Married, lives with husband Daron OfferCliff and one of their 3 children. She drinks 2 dies\t cokes and 1 glass of tea a day.She is active.    REVIEW OF SYSTEMS: Constitutional: No fevers, chills, or sweats, no generalized fatigue, change in appetite Eyes: No visual changes, double vision, eye pain Ear, nose and throat: No hearing loss, ear pain, nasal congestion, sore throat Cardiovascular: No chest pain, palpitations Respiratory:  No shortness of breath at rest or with exertion, wheezes GastrointestinaI: No nausea, vomiting, diarrhea, abdominal pain, fecal incontinence Genitourinary:  No dysuria, urinary retention or frequency Musculoskeletal:  No neck pain, back pain Integumentary: No rash, pruritus, skin lesions Neurological: as above Psychiatric: No depression, insomnia, anxiety Endocrine: No palpitations, fatigue, diaphoresis, mood swings, change in appetite, change in weight, increased thirst Hematologic/Lymphatic:  No purpura, petechiae. Allergic/Immunologic: no itchy/runny eyes, nasal congestion, recent allergic reactions, rashes  PHYSICAL EXAM: Vitals:   12/29/16 0949  BP: 140/82  Pulse: 76  SpO2: 96%   General: No acute distress.  Patient appears well-groomed.  Head:  Normocephalic/atraumatic Eyes:  fundi examined but not visualized Neck: supple, no paraspinal tenderness, full range of motion Back: No paraspinal tenderness Heart: regular rate and rhythm Lungs: Clear to auscultation bilaterally. Vascular: No carotid bruits. Neurological Exam: Mental status: alert and oriented to person, place, and time, recent and remote memory intact, fund of knowledge intact, attention and concentration intact, speech fluent and not dysarthric, language intact. Cranial nerves: CN I: not tested CN II: pupils equal, round and reactive to light, visual fields intact CN III, IV, VI:  full range of motion, no nystagmus, no ptosis CN V: facial sensation  intact CN VII: upper and lower face symmetric CN VIII: hearing intact CN IX, X: gag intact, uvula midline CN XI: sternocleidomastoid and trapezius muscles intact CN XII: tongue midline Bulk & Tone: normal, no fasciculations. Motor:  5/5 throughout  Sensation: temperature and vibration sensation intact. Deep Tendon Reflexes:  2+ throughout, toes downgoing.  Finger to nose testing:  Without dysmetria.  Heel to shin:  Without dysmetria.  Gait:  Normal station and stride.  Able to turn and tandem walk. Romberg negative.  IMPRESSION: Probable abdominal migraine or cyclic vomiting syndrome  PLAN: 1.  Severe attacks dissipated, possibly due to the amitriptyline.  We will increase dose to 20mg  at bedtime.  We can further increase dose to 50mg  at bedtime in 4 to 6 weeks if needed. 2.  For abortive therapy, continue sumatriptan 50mg .  If need to repeat dose in two hours, take with naproxen 500mg . 3.  Limit use of pain relievers to no more than 2 days out of the week to prevent rebound  headache. 4.  Hydration 5.  Follow up in 3 months.   Thank you for allowing me to take part in the care of this patient.  Shon Millet, DO  CC:  Bethann Punches, MD  Amy Monica Becton, PA-C

## 2017-03-22 ENCOUNTER — Other Ambulatory Visit: Payer: Self-pay | Admitting: Neurology

## 2017-03-24 ENCOUNTER — Encounter: Payer: Self-pay | Admitting: Endocrinology

## 2017-04-13 ENCOUNTER — Ambulatory Visit: Payer: BC Managed Care – PPO | Admitting: Neurology

## 2017-05-30 ENCOUNTER — Ambulatory Visit (INDEPENDENT_AMBULATORY_CARE_PROVIDER_SITE_OTHER): Payer: BC Managed Care – PPO | Admitting: Obstetrics and Gynecology

## 2017-05-30 ENCOUNTER — Encounter: Payer: Self-pay | Admitting: Obstetrics and Gynecology

## 2017-05-30 ENCOUNTER — Telehealth: Payer: Self-pay

## 2017-05-30 VITALS — BP 124/68 | HR 80 | Ht 66.5 in | Wt 135.0 lb

## 2017-05-30 DIAGNOSIS — B3731 Acute candidiasis of vulva and vagina: Secondary | ICD-10-CM

## 2017-05-30 DIAGNOSIS — B373 Candidiasis of vulva and vagina: Secondary | ICD-10-CM | POA: Diagnosis not present

## 2017-05-30 IMAGING — CT CT HEAD W/O CM
3 series · 16 of 47 positions shown, 19 images · non-contrast
Comparison: None.

CLINICAL DATA: Headache and nausea, onset today.

EXAM:
CT HEAD WITHOUT CONTRAST
TECHNIQUE: Contiguous axial images were obtained from the base of the skull
through the vertex without intravenous contrast.

[Series 2: head wo · axial · 0.41mm/px · z∈[+418,+548]mm · 10 of 32 slices shown, 13 images]
[im 3/32  brain]
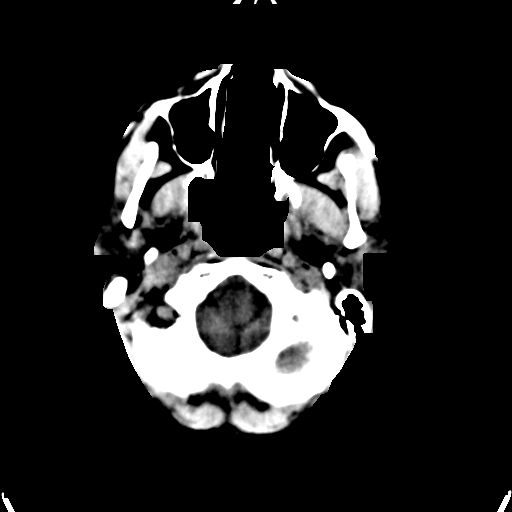
[im 3/32  bone]
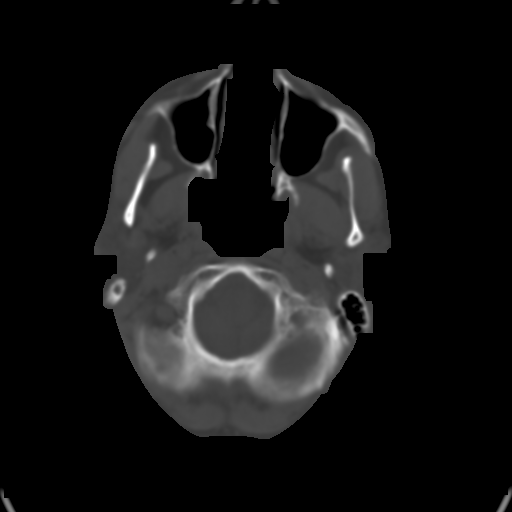
[im 6/32  brain]
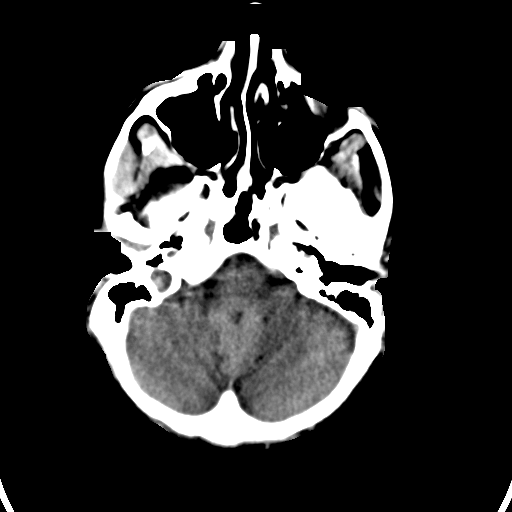
[im 9/32  brain]
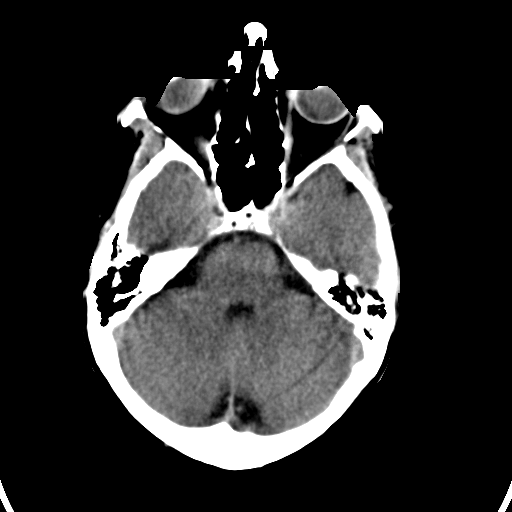
[im 11/32  brain]
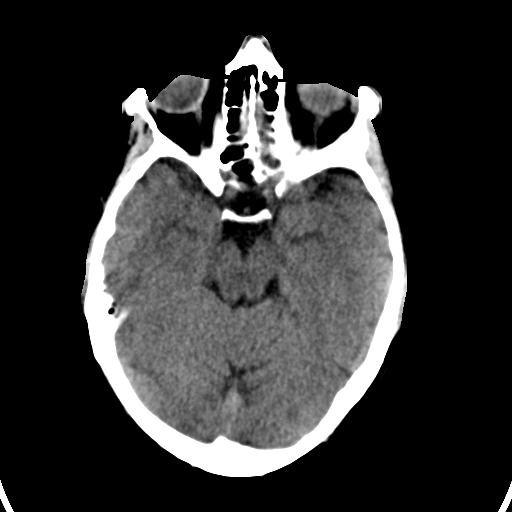
[im 14/32  brain]
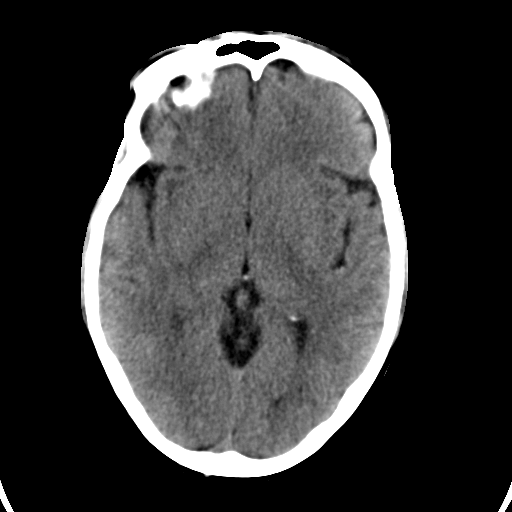
[im 14/32  bone]
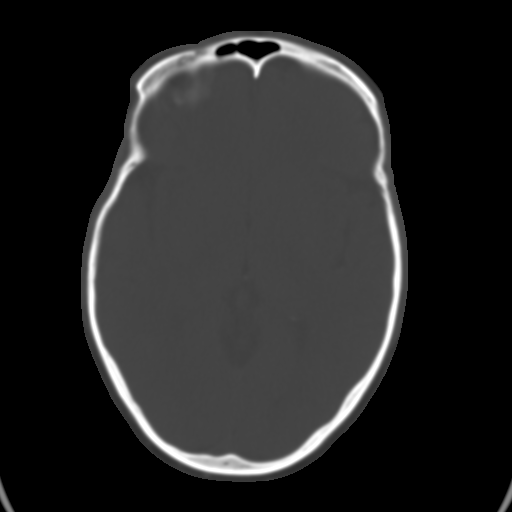
[im 18/32  brain]
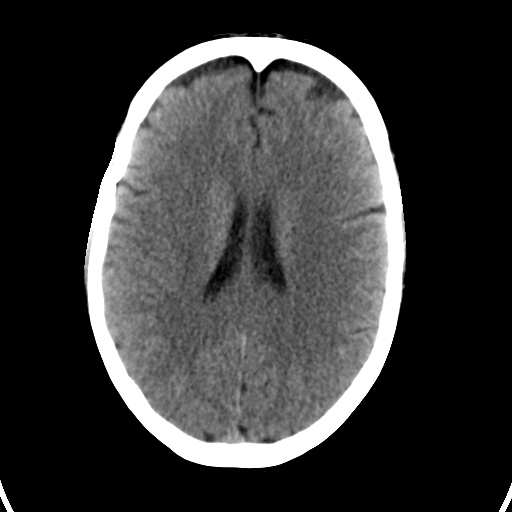
[im 21/32  brain]
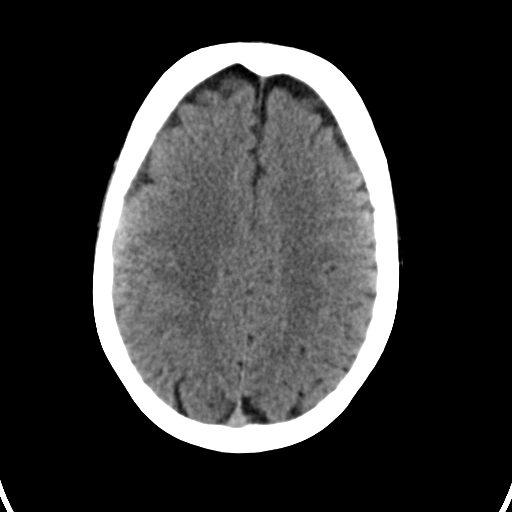
[im 24/32  brain]
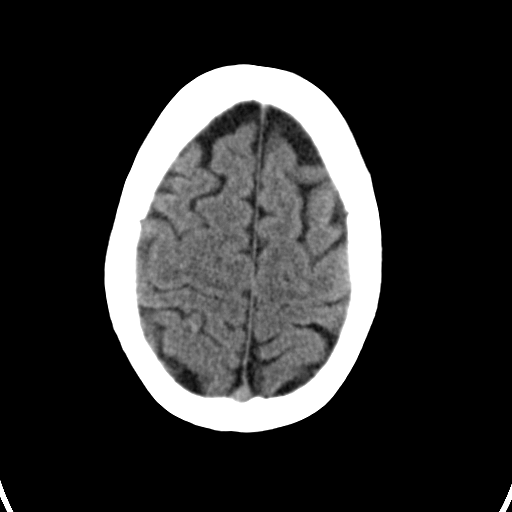
[im 26/32  brain]
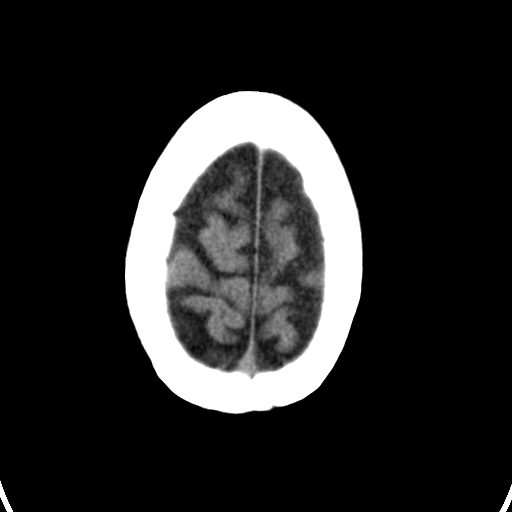
[im 26/32  bone]
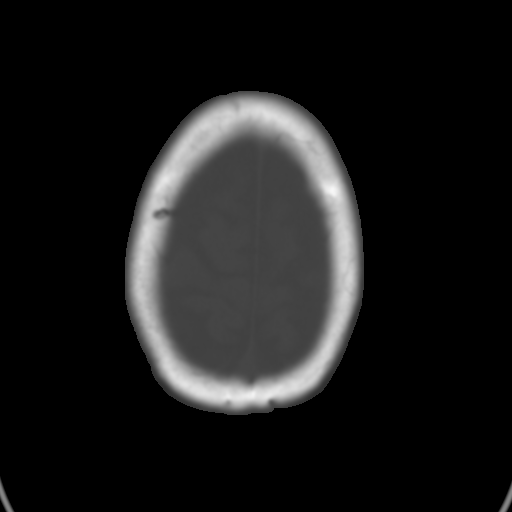
[im 29/32  brain]
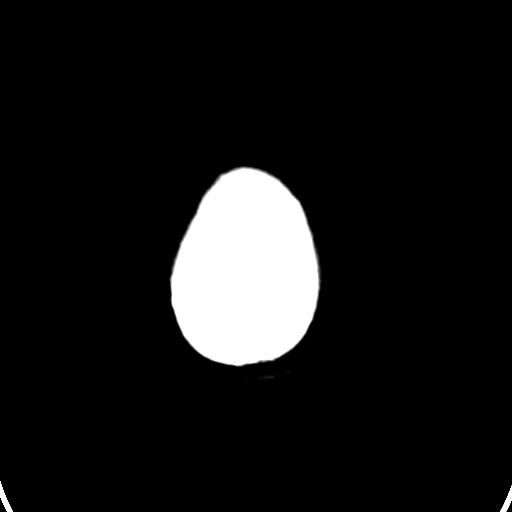

[Series 4: coronal soft · coronal · 0.32mm/px · 3 of 62 slices shown]
[im 21/62  brain]
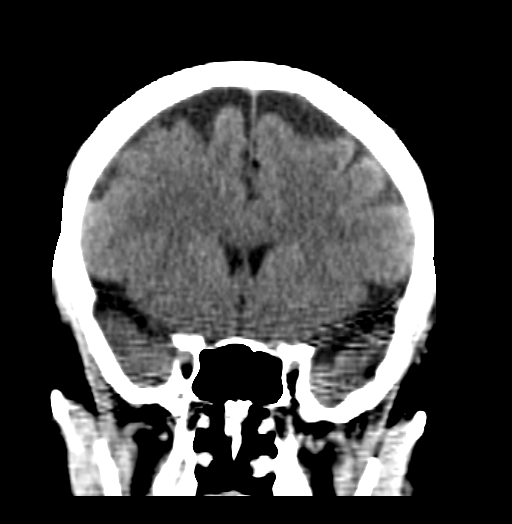
[im 28/62  brain]
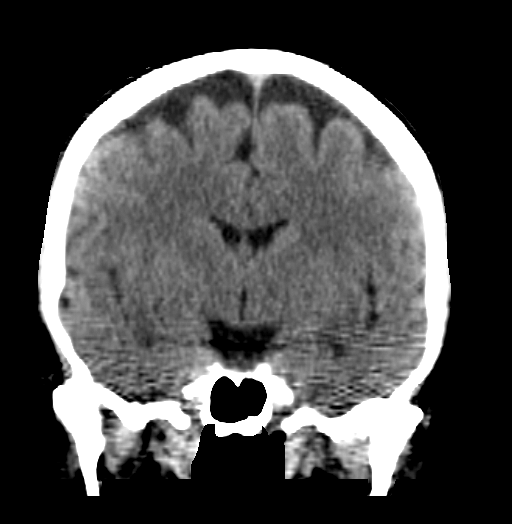
[im 34/62  brain]
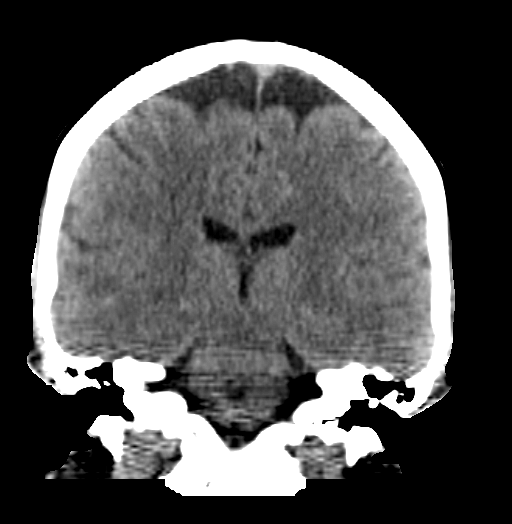

[Series 5: sagittal soft · sagittal · 0.32mm/px · 3 of 45 slices shown]
[im 15/45  brain]
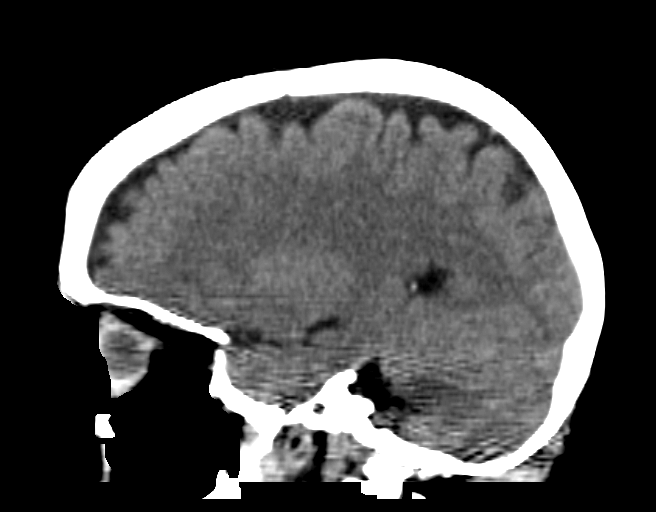
[im 23/45  brain]
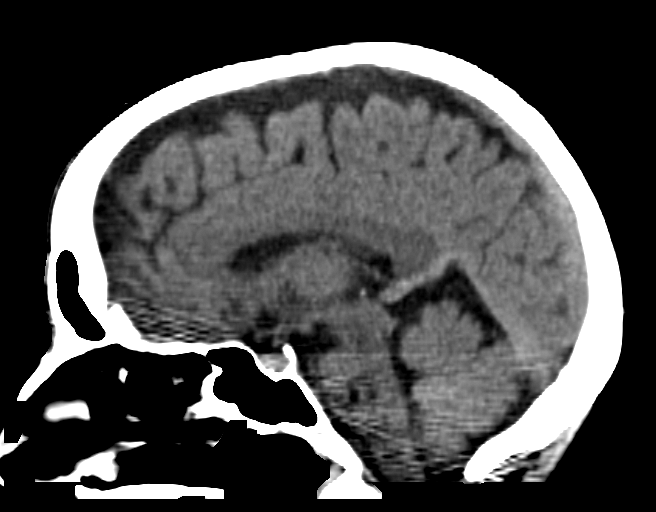
[im 30/45  brain]
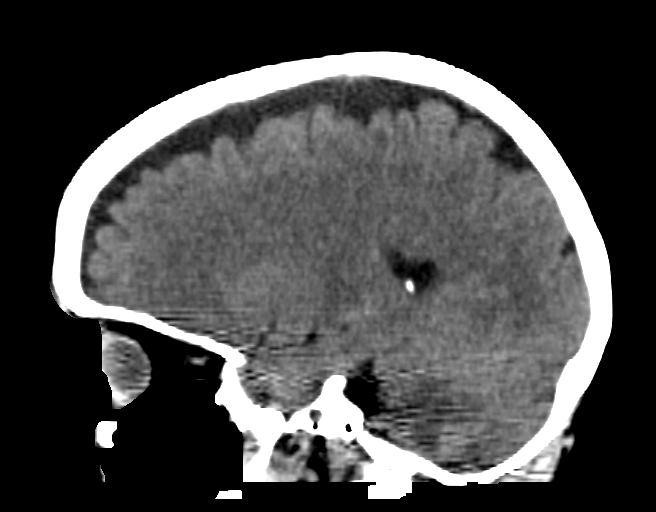

[16 of 47 positions shown; findings below may reference images not displayed]

FINDINGS: Mild generalized cerebral atrophy. No intracranial hemorrhage, mass
effect, or midline shift. No hydrocephalus. The basilar cisterns are
patent. No evidence of territorial infarct. No intracranial fluid
collection. Calvarium is intact. Included paranasal sinuses and
mastoid air cells are well aerated.
IMPRESSION: 1.  No acute intracranial abnormality.
2. Mild cerebral atrophy.

## 2017-05-30 MED ORDER — FLUCONAZOLE 150 MG PO TABS: 150.0000 mg | ORAL_TABLET | Freq: Once | ORAL | 0 refills | Status: DC

## 2017-05-30 NOTE — Progress Notes (Signed)
Obstetrics & Gynecology Office Visit   Chief Complaint:  Chief Complaint  Patient presents with  . Vaginitis    vaginal itching/discharge w/slight odor    History of Present Illness: [name] is a 62 y.o. G3P3 who LMP was No LMP recorded. Patient has had a hysterectomy., presents today for a problem visit.   Patient complains of an abnormal vaginal discharge for 4 days. Discharge described as: white. Vaginal symptoms include local irritation and vulvar itching.   Other associated symptoms: none.Menstrual pattern: She has not associated menstrual complaints (prior hysterectomy).  She denies recent antibiotic exposure, denies changes in soaps, detergents coinciding with the onset of her symptoms.  She has not previously self treated or been under treatment by another provider for these symptoms.   Has has yeast infections previously but infrequent, no issues with recurrent yeast infections.    Review of Systems: Review of Systems  Genitourinary: Negative.   Skin: Positive for itching. Negative for rash.  Endo/Heme/Allergies: Negative.     Past Medical History:  Past Medical History:  Diagnosis Date  . IBS (irritable bowel syndrome)   . Mitral valve prolapse   . Osteoporosis     Past Surgical History:  Past Surgical History:  Procedure Laterality Date  . ABDOMINAL HYSTERECTOMY  2005  . BREAST CYST ASPIRATION Left    years ago as per pt.    Gynecologic History: No LMP recorded. Patient has had a hysterectomy.  Obstetric History: G3P3  Family History:  Family History  Problem Relation Age of Onset  . Cancer Maternal Aunt 50       breast  . Breast cancer Maternal Aunt   . Deep vein thrombosis Mother   . Heart disease Mother   . Mitral valve prolapse Mother   . COPD Mother   . CAD Father        died at age 60    Social History:  Social History   Socioeconomic History  . Marital status: Married    Spouse name: Megan Tapia  . Number of children: 3  . Years  of education: Not on file  . Highest education level: Master's degree (e.g., MA, MS, MEng, MEd, MSW, MBA)  Occupational History  . Occupation: Comptrollerlibrarian  Social Needs  . Financial resource strain: Not on file  . Food insecurity:    Worry: Not on file    Inability: Not on file  . Transportation needs:    Medical: Not on file    Non-medical: Not on file  Tobacco Use  . Smoking status: Never Smoker  . Smokeless tobacco: Never Used  Substance and Sexual Activity  . Alcohol use: No  . Drug use: No  . Sexual activity: Yes    Birth control/protection: None  Lifestyle  . Physical activity:    Days per week: Not on file    Minutes per session: Not on file  . Stress: Not on file  Relationships  . Social connections:    Talks on phone: Not on file    Gets together: Not on file    Attends religious service: Not on file    Active member of club or organization: Not on file    Attends meetings of clubs or organizations: Not on file    Relationship status: Not on file  . Intimate partner violence:    Fear of current or ex partner: Not on file    Emotionally abused: Not on file    Physically abused:  Not on file    Forced sexual activity: Not on file  Other Topics Concern  . Not on file  Social History Narrative   Married, lives with husband Megan Offer and one of their 3 children. She drinks 2 dies\t cokes and 1 glass of tea a day.She is active.    Allergies:  Allergies  Allergen Reactions  . Boniva [Ibandronic Acid] Nausea And Vomiting and Other (See Comments)    Reaction:  Muscle pain     Medications: Prior to Admission medications   Medication Sig Start Date End Date Taking? Authorizing Provider  amitriptyline (ELAVIL) 10 MG tablet TAKE 2 TABLETS BY MOUTH AT BEDTIME 03/22/17  Yes Jaffe, Adam R, DO  escitalopram (LEXAPRO) 5 MG tablet Take 5 mg daily by mouth. 12/01/16  Yes [provider]  levocetirizine (XYZAL) 5 MG tablet Take 5 mg every evening by mouth.   Yes [provider]  montelukast (SINGULAIR) 10 MG tablet Take 10 mg 2 (two) times daily by mouth. 01/20/16  Yes [provider]  naproxen (NAPROSYN) 500 MG tablet Take 1 tablet (500 mg total) every 12 (twelve) hours as needed by mouth. 12/29/16  Yes Jaffe, Adam R, DO  ondansetron (ZOFRAN) 4 MG tablet Take 1 tablet (4 mg total) by mouth every 8 (eight) hours as needed for nausea or vomiting. 07/23/15  Yes Jennye Moccasin, MD  SUMAtriptan (IMITREX) 50 MG tablet Take 1 tablet earliest onset of migraine.  May repeat once in 2 hours if headache persists or recurs. 12/29/16  Yes Jaffe, Adam R, DO  fluconazole (DIFLUCAN) 150 MG tablet Take 1 tablet (150 mg total) by mouth once for 1 dose. Can take additional dose three days later if symptoms persist 05/30/17 05/30/17  Vena Austria, MD  potassium chloride 20 MEQ TBCR Take 20 mEq by mouth daily. 07/23/15 07/28/15  Jennye Moccasin, MD    Physical Exam Vitals:  Vitals:   05/30/17 1553  BP: 124/68  Pulse: 80   No LMP recorded. Patient has had a hysterectomy.  General: NAD HEENT: normocephalic, anicteric Pulmonary: No increased work of breathing  Genitourinary:  External: Normal external female genitalia.  Normal urethral meatus, normal Bartholin's and Skene's glands.    Vagina: Normal vaginal mucosa, no evidence of prolapse.    Rectal: deferred Extremities: no edema, erythema, or tenderness Neurologic: Grossly intact Psychiatric: mood appropriate, affect full  Female chaperone present for pelvic portions of the physical exam  Wet Prep: PH: 4.5 Clue Cells: Negative Fungal elements: Positive Trichomonas: Negative  Assessment: 62 y.o. G3P3 with vaginal candida infection  Plan: Problem List Items Addressed This Visit    None    Visit Diagnoses    Vaginal candida    -  Primary   Relevant Medications   fluconazole (DIFLUCAN) 150 MG tablet      1) Risk factors for bacterial vaginosis and candida infections discussed.  We discussed  normal vaginal flora/microbiome.  Any factors that may alter the microbiome increase the risk of these opportunistic infections.  These include changes in pH, antibiotic exposures, diabetes, wet bathing suits etc.  We discussed that treatment is aimed at eradicating abnormal bacterial overgrowth and or yeast.  There may be some role for vaginal probiotics in restoring normal vaginal flora.   - Rx diflucan written, may repeat in 3-4 days if symptoms persist  2) A total of 15 minutes were spent in face-to-face contact with the patient during this encounter with over half of that time devoted to  counseling and coordination of care.  3) Return if symptoms worsen or fail to improve.   Vena Austria, MD, [facility], [facility] 05/30/2017, 8:57 PM

## 2017-05-30 NOTE — Telephone Encounter (Signed)
Calling wanting to see AMS for a yeast, Pt aware of over the counter rx, pt has a appointment to come in at 4:10

## 2017-05-31 ENCOUNTER — Telehealth: Payer: Self-pay

## 2017-05-31 ENCOUNTER — Other Ambulatory Visit: Payer: Self-pay | Admitting: Obstetrics and Gynecology

## 2017-05-31 MED ORDER — FLUCONAZOLE 150 MG PO TABS: 150.0000 mg | ORAL_TABLET | Freq: Once | ORAL | 0 refills | Status: AC

## 2017-05-31 NOTE — Telephone Encounter (Signed)
Pt called after hours triage stating she saw [facility] for a yeast infection and went to pick up her prescription. Pt stated the pharmacy is saying they do not have a Rx request for her medication.

## 2017-05-31 NOTE — Telephone Encounter (Signed)
Pt aware via voicemail 

## 2017-05-31 NOTE — Telephone Encounter (Signed)
Shows up as sent yesterday I just resent it though can we confirm pharmacy as well it was sent to [facility]

## 2017-05-31 NOTE — Telephone Encounter (Signed)
Please advise 

## 2017-11-04 ENCOUNTER — Other Ambulatory Visit: Payer: Self-pay | Admitting: Obstetrics and Gynecology

## 2017-11-18 ENCOUNTER — Encounter: Payer: Self-pay | Admitting: Obstetrics and Gynecology

## 2017-11-18 ENCOUNTER — Ambulatory Visit (INDEPENDENT_AMBULATORY_CARE_PROVIDER_SITE_OTHER): Payer: BC Managed Care – PPO | Admitting: Obstetrics and Gynecology

## 2017-11-18 VITALS — BP 122/66 | HR 64 | Ht 67.0 in | Wt 143.0 lb

## 2017-11-18 DIAGNOSIS — Z1239 Encounter for other screening for malignant neoplasm of breast: Secondary | ICD-10-CM

## 2017-11-18 DIAGNOSIS — Z01419 Encounter for gynecological examination (general) (routine) without abnormal findings: Secondary | ICD-10-CM

## 2017-11-18 DIAGNOSIS — Z1211 Encounter for screening for malignant neoplasm of colon: Secondary | ICD-10-CM

## 2017-11-18 DIAGNOSIS — Z1231 Encounter for screening mammogram for malignant neoplasm of breast: Secondary | ICD-10-CM

## 2017-11-18 DIAGNOSIS — R350 Frequency of micturition: Secondary | ICD-10-CM

## 2017-11-18 LAB — POCT URINALYSIS DIPSTICK
BILIRUBIN UA: NEGATIVE
Blood, UA: NEGATIVE
GLUCOSE UA: NEGATIVE
Ketones, UA: NEGATIVE
Leukocytes, UA: NEGATIVE
Nitrite, UA: NEGATIVE
Protein, UA: NEGATIVE
SPEC GRAV UA: 1.01 (ref 1.010–1.025)
Urobilinogen, UA: NEGATIVE E.U./dL — AB
pH, UA: 6 (ref 5.0–8.0)

## 2017-11-18 MED ORDER — FLUCONAZOLE 150 MG PO TABS: 150.0000 mg | ORAL_TABLET | Freq: Once | ORAL | 0 refills | Status: AC

## 2017-11-18 NOTE — Patient Instructions (Signed)
[facility] [address] [place] [address]  [facility] [place]  [address] [place] [address]  Phone: [phone]  

## 2017-11-18 NOTE — Progress Notes (Signed)
Gynecology Annual Exam  PCP: Danella Penton, MD  Chief Complaint:  Chief Complaint  Patient presents with  . Gynecologic Exam    frequent urination    History of Present Illness:Patient is a 62 y.o. G3P3 presents for annual exam. The patient has no complaints today.   LMP: No LMP recorded. Patient has had a hysterectomy.  The patient is sexually active. She denies dyspareunia.  The patient does perform self breast exams.  There is no notable family history of breast or ovarian cancer in her family.  The patient wears seatbelts: yes.   The patient has regular exercise: not asked.    The patient denies current symptoms of depression.     Review of Systems: ROS  Past Medical History:  Past Medical History:  Diagnosis Date  . IBS (irritable bowel syndrome)   . Mitral valve prolapse   . Osteoporosis     Past Surgical History:  Past Surgical History:  Procedure Laterality Date  . ABDOMINAL HYSTERECTOMY  2005  . BREAST CYST ASPIRATION Left    years ago as per pt.    Gynecologic History:  No LMP recorded. Patient has had a hysterectomy. Last Pap: Results were: 08/15/2014 no abnormalities  Last mammogram: 11/14/2016 Results were: BI-RAD I  Obstetric History: G3P3  Family History:  Family History  Problem Relation Age of Onset  . Cancer Maternal Aunt 50       breast  . Breast cancer Maternal Aunt   . Deep vein thrombosis Mother   . Heart disease Mother   . Mitral valve prolapse Mother   . COPD Mother   . CAD Father        died at age 49    Social History:  Social History   Socioeconomic History  . Marital status: Married    Spouse name: Daron Offer  . Number of children: 3  . Years of education: Not on file  . Highest education level: Master's degree (e.g., MA, MS, MEng, MEd, MSW, MBA)  Occupational History  . Occupation: Comptroller  Social Needs  . Financial resource strain: Not on file  . Food insecurity:    Worry: Not on file    Inability: Not on file   . Transportation needs:    Medical: Not on file    Non-medical: Not on file  Tobacco Use  . Smoking status: Never Smoker  . Smokeless tobacco: Never Used  Substance and Sexual Activity  . Alcohol use: No  . Drug use: No  . Sexual activity: Yes    Birth control/protection: None  Lifestyle  . Physical activity:    Days per week: Not on file    Minutes per session: Not on file  . Stress: Not on file  Relationships  . Social connections:    Talks on phone: Not on file    Gets together: Not on file    Attends religious service: Not on file    Active member of club or organization: Not on file    Attends meetings of clubs or organizations: Not on file    Relationship status: Not on file  . Intimate partner violence:    Fear of current or ex partner: Not on file    Emotionally abused: Not on file    Physically abused: Not on file    Forced sexual activity: Not on file  Other Topics Concern  . Not on file  Social History Narrative   Married, lives with husband Hayti and  one of their 3 children. She drinks 2 dies\t cokes and 1 glass of tea a day.She is active.    Allergies:  Allergies  Allergen Reactions  . Boniva [Ibandronic Acid] Nausea And Vomiting and Other (See Comments)    Reaction:  Muscle pain     Medications: Prior to Admission medications   Medication Sig Start Date End Date Taking? Authorizing Provider  amitriptyline (ELAVIL) 10 MG tablet TAKE 2 TABLETS BY MOUTH AT BEDTIME 03/22/17  Yes Jaffe, Adam R, DO  escitalopram (LEXAPRO) 5 MG tablet Take 5 mg daily by mouth. 12/01/16  Yes [provider]  levocetirizine (XYZAL) 5 MG tablet Take 5 mg every evening by mouth.   Yes [provider]  montelukast (SINGULAIR) 10 MG tablet Take 10 mg 2 (two) times daily by mouth. 01/20/16  Yes [provider]  naproxen (NAPROSYN) 500 MG tablet Take 1 tablet (500 mg total) every 12 (twelve) hours as needed by mouth. 12/29/16  Yes Jaffe, Adam R, DO    ondansetron (ZOFRAN) 4 MG tablet Take 1 tablet (4 mg total) by mouth every 8 (eight) hours as needed for nausea or vomiting. 07/23/15  Yes Jennye Moccasin, MD  SUMAtriptan (IMITREX) 50 MG tablet Take 1 tablet earliest onset of migraine.  May repeat once in 2 hours if headache persists or recurs. 12/29/16  Yes Jaffe, Adam R, DO  potassium chloride 20 MEQ TBCR Take 20 mEq by mouth daily. 07/23/15 07/28/15  Jennye Moccasin, MD    Physical Exam Vitals: Blood pressure 122/66, pulse 64, height 5\' 7"  (1.702 m), weight 143 lb (64.9 kg).  General: NAD HEENT: normocephalic, anicteric Thyroid: no enlargement, no palpable nodules Pulmonary: No increased work of breathing, CTAB Cardiovascular: RRR, distal pulses 2+ Breast: Breast symmetrical, no tenderness, no palpable nodules or masses, no skin or nipple retraction present, no nipple discharge.  No axillary or supraclavicular lymphadenopathy. Abdomen: NABS, soft, non-tender, non-distended.  Umbilicus without lesions.  No hepatomegaly, splenomegaly or masses palpable. No evidence of hernia  Genitourinary:  External: Normal external female genitalia.  Normal urethral meatus, normal Bartholin's and Skene's glands.    Vagina: Normal vaginal mucosa, no evidence of prolapse.    Cervix: surgicaly absent  Uterus: surgicaly absent  Adnexa: ovaries non-enlarged, no adnexal masses  Rectal: deferred  Lymphatic: no evidence of inguinal lymphadenopathy Extremities: no edema, erythema, or tenderness Neurologic: Grossly intact Psychiatric: mood appropriate, affect full  Female chaperone present for pelvic and breast  portions of the physical exam     Assessment: 62 y.o. G3P3 routine annual exam  Plan: Problem List Items Addressed This Visit    None    Visit Diagnoses    Frequent urination    -  Primary   Relevant Orders   POCT urinalysis dipstick (Completed)   Urine Culture (Completed)   Encounter for gynecological examination without abnormal finding        Breast screening       Relevant Orders   MM 3D SCREEN BREAST BILATERAL   Colon cancer screening       Relevant Orders   Ambulatory referral to Gastroenterology      1) Mammogram - recommend yearly screening mammogram.  Mammogram Was ordered today  2) STI screening  was notoffered and therefore not obtained  3) ASCCP guidelines and rational discussed.  Patient opts for discontinue secondary to prior hysterectomy screening interval  4) Osteoporosis  - per USPTF routine screening DEXA at age 51  5) Routine healthcare maintenance including  cholesterol, diabetes screening discussed managed by PCP  6) Colonoscopy - 08/15/2012 with repeat recommended 2019 order Dr. Bluford Kaufmann   7) Return in about 1 year (around 11/19/2018) for annual.    Vena Austria, MD [facility], [facility] 11/18/2017, 8:25 AM

## 2017-11-20 LAB — URINE CULTURE

## 2018-01-09 ENCOUNTER — Ambulatory Visit
Admission: RE | Admit: 2018-01-09 | Discharge: 2018-01-09 | Disposition: A | Discharge status: Home / Self Care | Payer: BC Managed Care – PPO | Source: Ambulatory Visit | Attending: Obstetrics and Gynecology | Admitting: Obstetrics and Gynecology

## 2018-01-09 DIAGNOSIS — Z1239 Encounter for other screening for malignant neoplasm of breast: Secondary | ICD-10-CM | POA: Diagnosis present

## 2018-03-11 ENCOUNTER — Other Ambulatory Visit: Payer: Self-pay | Admitting: Neurology

## 2018-11-30 ENCOUNTER — Other Ambulatory Visit: Payer: Self-pay | Admitting: Internal Medicine

## 2018-11-30 DIAGNOSIS — Z1231 Encounter for screening mammogram for malignant neoplasm of breast: Secondary | ICD-10-CM

## 2019-01-11 ENCOUNTER — Ambulatory Visit
Admission: RE | Admit: 2019-01-11 | Discharge: 2019-01-11 | Disposition: A | Discharge status: Home / Self Care | Payer: BC Managed Care – PPO | Source: Ambulatory Visit | Attending: Internal Medicine | Admitting: Internal Medicine

## 2019-01-11 DIAGNOSIS — Z1231 Encounter for screening mammogram for malignant neoplasm of breast: Secondary | ICD-10-CM

## 2019-06-08 ENCOUNTER — Other Ambulatory Visit: Payer: Self-pay

## 2019-06-08 ENCOUNTER — Ambulatory Visit: Payer: BC Managed Care – PPO | Attending: Internal Medicine

## 2019-06-08 DIAGNOSIS — Z23 Encounter for immunization: Secondary | ICD-10-CM

## 2019-06-08 NOTE — Progress Notes (Signed)
   Covid-19 Vaccination Clinic  Name:  Megan Tapia    MRN: [id number] DOB: February 18, 1956  06/08/2019  Megan Tapia was observed post Covid-19 immunization for 15 minutes without incident. She was provided with Vaccine Information Sheet and instruction to access the V-Safe system.   Megan Tapia was instructed to call 911 with any severe reactions post vaccine: Marland Kitchen Difficulty breathing  . Swelling of face and throat  . A fast heartbeat  . A bad rash all over body  . Dizziness and weakness   Immunizations Administered    Name Date Dose VIS Date Route   Pfizer COVID-19 Vaccine 06/08/2019  9:46 AM 0.3 mL 02/02/2019 Intramuscular   Manufacturer: [employer], [employer]   Lot: [id number]   NDC: [address]-0

## 2019-07-03 ENCOUNTER — Ambulatory Visit: Payer: BC Managed Care – PPO | Attending: Internal Medicine

## 2019-07-03 DIAGNOSIS — Z23 Encounter for immunization: Secondary | ICD-10-CM

## 2019-07-03 NOTE — Progress Notes (Signed)
   Covid-19 Vaccination Clinic  Name:  CARON ODE    MRN: [id number] DOB: 1956-01-08  07/03/2019  Ms. Hohn was observed post Covid-19 immunization for 15 minutes without incident. She was provided with Vaccine Information Sheet and instruction to access the V-Safe system.   Ms. Starkel was instructed to call 911 with any severe reactions post vaccine: Marland Kitchen Difficulty breathing  . Swelling of face and throat  . A fast heartbeat  . A bad rash all over body  . Dizziness and weakness   Immunizations Administered    Name Date Dose VIS Date Route   Pfizer COVID-19 Vaccine 07/03/2019  3:54 PM 0.3 mL 04/18/2018 Intramuscular   Manufacturer: [employer], [employer]   Lot: [id number]   NDC: [address]-9

## 2020-02-11 ENCOUNTER — Other Ambulatory Visit: Payer: Self-pay | Admitting: Internal Medicine

## 2020-02-11 DIAGNOSIS — Z1231 Encounter for screening mammogram for malignant neoplasm of breast: Secondary | ICD-10-CM

## 2020-02-19 ENCOUNTER — Other Ambulatory Visit: Payer: Self-pay

## 2020-02-19 ENCOUNTER — Ambulatory Visit
Admission: RE | Admit: 2020-02-19 | Discharge: 2020-02-19 | Disposition: A | Discharge status: Home / Self Care | Payer: BC Managed Care – PPO | Source: Ambulatory Visit | Attending: Internal Medicine | Admitting: Internal Medicine

## 2020-02-19 DIAGNOSIS — Z1231 Encounter for screening mammogram for malignant neoplasm of breast: Secondary | ICD-10-CM | POA: Diagnosis present

## 2020-07-30 ENCOUNTER — Encounter: Payer: Self-pay | Admitting: Obstetrics and Gynecology

## 2020-07-30 ENCOUNTER — Ambulatory Visit (INDEPENDENT_AMBULATORY_CARE_PROVIDER_SITE_OTHER): Payer: BC Managed Care – PPO | Admitting: Obstetrics and Gynecology

## 2020-07-30 ENCOUNTER — Other Ambulatory Visit: Payer: Self-pay

## 2020-07-30 VITALS — BP 136/84 | Ht 67.0 in | Wt 149.0 lb

## 2020-07-30 DIAGNOSIS — Z01419 Encounter for gynecological examination (general) (routine) without abnormal findings: Secondary | ICD-10-CM | POA: Diagnosis not present

## 2020-07-30 DIAGNOSIS — Z1231 Encounter for screening mammogram for malignant neoplasm of breast: Secondary | ICD-10-CM | POA: Diagnosis not present

## 2020-07-30 DIAGNOSIS — Z1239 Encounter for other screening for malignant neoplasm of breast: Secondary | ICD-10-CM

## 2020-07-30 DIAGNOSIS — N644 Mastodynia: Secondary | ICD-10-CM | POA: Diagnosis not present

## 2020-07-30 NOTE — Patient Instructions (Signed)
[facility] [address] [place] [address]  [facility]  [address] [place] [address]  Phone: [phone]  

## 2020-07-30 NOTE — Progress Notes (Signed)
Gynecology Annual Exam  PCP: Lindwood Coke, MD  Chief Complaint:  Chief Complaint  Patient presents with   Gynecologic Exam    Annual - Lt breast pain/achy. RM 4    History of Present Illness:Patient is a 65 y.o. G3P3 presents for annual exam. The patient has no complaints today.   LMP: No LMP recorded. Patient has had a hysterectomy. No postmenopausal bleeding.    The patient is sexually active. She denies dyspareunia.  The patient does perform self breast exams.  There is no notable family history of breast or ovarian cancer in her family.  The patient wears seatbelts: yes.   The patient has regular exercise: not asked.    The patient denies current symptoms of depression.     Review of Systems: ROS  Past Medical History:  Patient Active Problem List   Diagnosis Date Noted   Allergic urticaria 09/16/2016    Chronic intermittent    Breast pain 05/05/2013    Past Surgical History:  Past Surgical History:  Procedure Laterality Date   ABDOMINAL HYSTERECTOMY  2005   BREAST CYST ASPIRATION Left    years ago as per pt.    Gynecologic History:  No LMP recorded. Patient has had a hysterectomy. Last Pap: Results were: [name] s/p prior hysterectomy Last mammogram: 02/19/2020 Results were: BI-RAD I  Obstetric History: G3P3  Family History:  Family History  Problem Relation Age of Onset   Cancer Maternal Aunt 50       breast   Breast cancer Maternal Aunt    Deep vein thrombosis Mother    Heart disease Mother    Mitral valve prolapse Mother    COPD Mother    CAD Father        died at age 97   Hypertension Sister     Social History:  Social History   Socioeconomic History   Marital status: Married    Spouse name: Animal nutritionist   Number of children: 3   Years of education: Not on file   Highest education level: Master's degree (e.g., MA, MS, MEng, MEd, MSW, MBA)  Occupational History   Occupation: Comptroller  Tobacco Use   Smoking status: Never Smoker    Smokeless tobacco: Never Used  Building services engineer Use: Never used  Substance and Sexual Activity   Alcohol use: No   Drug use: No   Sexual activity: Yes    Birth control/protection: None  Other Topics Concern   Not on file  Social History Narrative   Married, lives with husband Lovell and one of their 3 children. She drinks 2 dies\t cokes and 1 glass of tea a day.She is active.   Social Determinants of Health   Financial Resource Strain: Not on file  Food Insecurity: Not on file  Transportation Needs: Not on file  Physical Activity: Not on file  Stress: Not on file  Social Connections: Not on file  Intimate Partner Violence: Not on file    Allergies:  Allergies  Allergen Reactions   Boniva [Ibandronic Acid] Nausea And Vomiting and Other (See Comments)    Reaction:  Muscle pain     Medications: Prior to Admission medications   Medication Sig Start Date End Date Taking? Authorizing Provider  escitalopram (LEXAPRO) 5 MG tablet Take 5 mg daily by mouth. 12/01/16  Yes [provider]  levocetirizine (XYZAL) 5 MG tablet Take 5 mg every evening by mouth.   Yes [provider]  montelukast (SINGULAIR) 10  MG tablet Take 10 mg 2 (two) times daily by mouth. 01/20/16  Yes [provider]  amitriptyline (ELAVIL) 10 MG tablet TAKE 2 TABLETS BY MOUTH AT BEDTIME Patient not taking: Reported on 07/30/2020 03/22/17   Drema Dallas, DO  naproxen (NAPROSYN) 500 MG tablet Take 1 tablet (500 mg total) every 12 (twelve) hours as needed by mouth. 12/29/16   Everlena Cooper, Adam R, DO  ondansetron (ZOFRAN) 4 MG tablet Take 1 tablet (4 mg total) by mouth every 8 (eight) hours as needed for nausea or vomiting. 07/23/15   Jennye Moccasin, MD  SUMAtriptan (IMITREX) 50 MG tablet Take 1 tablet earliest onset of migraine.  May repeat once in 2 hours if headache persists or recurs. 12/29/16   Drema Dallas, DO    Physical Exam Vitals: Blood pressure 136/84, height 5\' 7"  (1.702 m), weight 149  lb (67.6 kg).  General: NAD HEENT: normocephalic, anicteric Thyroid: no enlargement, no palpable nodules Pulmonary: No increased work of breathing, CTAB Cardiovascular: RRR, distal pulses 2+ Breast: Breast symmetrical, no tenderness, no palpable nodules or masses, no skin or nipple retraction present, no nipple discharge.  No axillary or supraclavicular lymphadenopathy. Abdomen: NABS, soft, non-tender, non-distended.  Umbilicus without lesions.  No hepatomegaly, splenomegaly or masses palpable. No evidence of hernia  Genitourinary:  External: Normal external female genitalia.  Normal urethral meatus, normal Bartholin's and Skene's glands.    Vagina: Normal vaginal mucosa, no evidence of prolapse.    Cervix: surgically absent  Uterus: surgically absent  Adnexa: ovaries non-enlarged, no adnexal masses  Rectal: deferred  Lymphatic: no evidence of inguinal lymphadenopathy Extremities: no edema, erythema, or tenderness Neurologic: Grossly intact Psychiatric: mood appropriate, affect full  Female chaperone present for pelvic and breast  portions of the physical exam     Assessment: 65 y.o. G3P3 routine annual exam  Plan: Problem List Items Addressed This Visit   None Visit Diagnoses     Encounter for gynecological examination without abnormal finding    -  Primary   Breast screening       Breast cancer screening by mammogram       Relevant Orders   MM 3D SCREEN BREAST BILATERAL   MM DIAG BREAST TOMO BILATERAL   66 BREAST LTD UNI LEFT INC AXILLA   Mastodynia of left breast       Relevant Orders   MM DIAG BREAST TOMO BILATERAL   US BREAST LTD UNI LEFT INC AXILLA       1) Mammogram - recommend yearly screening mammogram.  Mammogram Is up to date - left breast tenderness, no inciting event or trauma.  No palpable breast abnormalities today, will order diagnostic mammogram  2) STI screening  was notoffered and therefore not obtained  3) ASCCP guidelines and rational  discussed.  Patient opts for discontinue secondary to prior hysterectomy screening interval  4) Osteoporosis  - per USPTF routine screening DEXA at age 26  Consider FDA-approved medical therapies in postmenopausal women and men aged 15 years and older, based on the following: a) A hip or vertebral (clinical or morphometric) fracture b) T-score ? -2.5 at the femoral neck or spine after appropriate evaluation to exclude secondary causes C) Low bone mass (T-score between -1.0 and -2.5 at the femoral neck or spine) and a 10-year probability of a hip fracture ? 3% or a 10-year probability of a major osteoporosis-related fracture ? 20% based on the [place]-adapted WHO algorithm   5) Routine healthcare maintenance including cholesterol, diabetes screening  discussed managed by PCP  7) Return in about 1 year (around 07/30/2021) for annual.    Vena Austria, MD Domingo Pulse, [facility] 07/30/2020, 4:00 PM

## 2020-08-21 NOTE — Telephone Encounter (Signed)
-----   Message ----- From: Lilia Pro H Sent: 08/07/2020  11:36 AM EDT To: Vena Austria, MD  Per Delford Field, please change order to [id number]. They also asked if the pain is all over pain or a specific area. Please advise.

## 2020-08-22 ENCOUNTER — Other Ambulatory Visit: Payer: Self-pay | Admitting: Obstetrics and Gynecology

## 2020-08-22 DIAGNOSIS — N644 Mastodynia: Secondary | ICD-10-CM

## 2020-08-22 NOTE — Telephone Encounter (Signed)
Has been changed.

## 2020-09-09 ENCOUNTER — Other Ambulatory Visit: Payer: Self-pay

## 2020-09-09 ENCOUNTER — Ambulatory Visit
Admission: RE | Admit: 2020-09-09 | Discharge: 2020-09-09 | Disposition: A | Discharge status: Home / Self Care | Payer: BC Managed Care – PPO | Source: Ambulatory Visit | Attending: Obstetrics and Gynecology | Admitting: Obstetrics and Gynecology

## 2020-09-09 ENCOUNTER — Ambulatory Visit: Payer: BC Managed Care – PPO

## 2020-09-09 DIAGNOSIS — N644 Mastodynia: Secondary | ICD-10-CM | POA: Insufficient documentation

## 2021-03-18 ENCOUNTER — Other Ambulatory Visit: Payer: Self-pay | Admitting: Internal Medicine

## 2021-03-18 DIAGNOSIS — Z1231 Encounter for screening mammogram for malignant neoplasm of breast: Secondary | ICD-10-CM

## 2021-04-23 ENCOUNTER — Other Ambulatory Visit: Payer: Self-pay

## 2021-04-23 ENCOUNTER — Ambulatory Visit
Admission: RE | Admit: 2021-04-23 | Discharge: 2021-04-23 | Disposition: A | Discharge status: Home / Self Care | Payer: BC Managed Care – PPO | Source: Ambulatory Visit | Attending: Internal Medicine | Admitting: Internal Medicine

## 2021-04-23 DIAGNOSIS — Z1231 Encounter for screening mammogram for malignant neoplasm of breast: Secondary | ICD-10-CM | POA: Diagnosis present

## 2022-06-02 ENCOUNTER — Other Ambulatory Visit: Payer: Self-pay | Admitting: Internal Medicine

## 2022-06-02 DIAGNOSIS — Z1231 Encounter for screening mammogram for malignant neoplasm of breast: Secondary | ICD-10-CM

## 2022-06-08 ENCOUNTER — Other Ambulatory Visit: Payer: Self-pay

## 2022-06-08 ENCOUNTER — Emergency Department
Admission: EM | Admit: 2022-06-08 | Discharge: 2022-06-08 | Disposition: A | Discharge status: Home / Self Care | Payer: Medicare Other | Attending: Emergency Medicine | Admitting: Emergency Medicine

## 2022-06-08 DIAGNOSIS — R42 Dizziness and giddiness: Secondary | ICD-10-CM | POA: Diagnosis present

## 2022-06-08 DIAGNOSIS — F410 Panic disorder [episodic paroxysmal anxiety] without agoraphobia: Secondary | ICD-10-CM

## 2022-06-08 DIAGNOSIS — I10 Essential (primary) hypertension: Secondary | ICD-10-CM | POA: Insufficient documentation

## 2022-06-08 LAB — COMPREHENSIVE METABOLIC PANEL
ALT: 20 U/L (ref 0–44)
AST: 33 U/L (ref 15–41)
Albumin: 4.4 g/dL (ref 3.5–5.0)
Alkaline Phosphatase: 85 U/L (ref 38–126)
Anion gap: 10 (ref 5–15)
BUN: 13 mg/dL (ref 8–23)
CO2: 21 mmol/L — ABNORMAL LOW (ref 22–32)
Calcium: 9.5 mg/dL (ref 8.9–10.3)
Chloride: 103 mmol/L (ref 98–111)
Creatinine, Ser: 0.78 mg/dL (ref 0.44–1.00)
GFR, Estimated: >60 mL/min (ref >60)
Glucose, Bld: 145 mg/dL — ABNORMAL HIGH (ref 70–99)
Potassium: 3.9 mmol/L (ref 3.5–5.1)
Sodium: 134 mmol/L — ABNORMAL LOW (ref 135–145)
Total Bilirubin: 0.7 mg/dL (ref 0.3–1.2)
Total Protein: 7.2 g/dL (ref 6.5–8.1)

## 2022-06-08 LAB — CBC WITH DIFFERENTIAL/PLATELET
Abs Immature Granulocytes: 0.03 10*3/uL (ref 0.00–0.07)
Basophils Absolute: 0 10*3/uL (ref 0.0–0.1)
Basophils Relative: 1 %
Eosinophils Absolute: 0 10*3/uL (ref 0.0–0.5)
Eosinophils Relative: 0 %
HCT: 43.8 % (ref 36.0–46.0)
Hemoglobin: 14.8 g/dL (ref 12.0–15.0)
Immature Granulocytes: 1 %
Lymphocytes Relative: 9 %
Lymphs Abs: 0.6 10*3/uL — ABNORMAL LOW (ref 0.7–4.0)
MCH: 29.8 pg (ref 26.0–34.0)
MCHC: 33.8 g/dL (ref 30.0–36.0)
MCV: 88.3 fL (ref 80.0–100.0)
Monocytes Absolute: 0.3 10*3/uL (ref 0.1–1.0)
Monocytes Relative: 5 %
Neutro Abs: 5.5 10*3/uL (ref 1.7–7.7)
Neutrophils Relative %: 84 %
Platelets: 244 10*3/uL (ref 150–400)
RBC: 4.96 MIL/uL (ref 3.87–5.11)
RDW: 12.5 % (ref 11.5–15.5)
WBC: 6.4 10*3/uL (ref 4.0–10.5)
nRBC: 0 % (ref 0.0–0.2)

## 2022-06-08 LAB — CBG MONITORING, ED: Glucose-Capillary: 156 mg/dL — ABNORMAL HIGH (ref 70–99)

## 2022-06-08 MED ORDER — LORAZEPAM 1 MG PO TABS
1.0000 mg | ORAL_TABLET | Freq: Once | ORAL | Status: AC
  Administered 2022-06-08: 1 mg via ORAL
  Filled 2022-06-08: qty 1

## 2022-06-08 NOTE — ED Notes (Signed)
Sydni NT confirms will collect CBG and capture EKG soon.

## 2022-06-08 NOTE — ED Notes (Signed)
Pt states will follow up with PCP about BP. Denies hx of HTN or BP meds.

## 2022-06-08 NOTE — ED Triage Notes (Signed)
Pt checking into to be triaged after visiting mother in ER who just was transported to ICU. Pt with hx of anxiety and states feels like she is having a panic attack. Pt denies any pain.

## 2022-06-08 NOTE — ED Notes (Signed)
Pt appears to be asleep; in NAD; visitor at bedside.

## 2022-06-08 NOTE — ED Notes (Signed)
Pt intermittently hyperventilating; pt maintains a regular pattern of breathing when this happens.

## 2022-06-08 NOTE — ED Provider Notes (Signed)
[facility] Provider Note    Event Date/Time   First MD Initiated Contact with Patient 06/08/22 1022     (approximate)   History   Panic Attack   HPI  JAJAIRA RUIS is a 67 y.o. female past medical history of osteoporosis anxiety who presents with feeling lightheaded.  Patient was in the ER visiting her mom who was recently transported up to the ICU for cardiac issues.  She suddenly started feeling lightheaded and sick.  She notes denies syncope.  Denies chest pain or dyspnea.  She does feel anxious.  Says that she has had similar episodes in the past in the setting of hunger but also has prior panic attacks and is prescribed anxiety medication which she takes daily.  Has been under a lot of stress recently especially given her mom is in the hospital.  Patient denies focal numbness tingling weakness or vision change.     Past Medical History:  Diagnosis Date   IBS (irritable bowel syndrome)    Mitral valve prolapse    Osteoporosis     Patient Active Problem List   Diagnosis Date Noted   Allergic urticaria 09/16/2016   Breast pain 05/05/2013     Physical Exam  Triage Vital Signs: ED Triage Vitals [06/08/22 1020]  Enc Vitals Group     BP (!) 158/94     Pulse Rate 93     Resp 18     Temp 97.7 F (36.5 C)     Temp Source Axillary     SpO2 98 %     Weight      Height      Head Circumference      Peak Flow      Pain Score 0     Pain Loc      Pain Edu?      Excl. in GC?     Most recent vital signs: Vitals:   06/08/22 1055 06/08/22 1055  BP:    Pulse: 71 67  Resp:  20  Temp:    SpO2: 100% 100%     General: Awake, patient is anxious appearing eyes are shut CV:  Good peripheral perfusion.  Resp:  Patient is hyperventilating lungs are clear abd:  No distention.  Neuro:             Awake, Alert, Oriented x 3  Other:  Aox3, nml speech  PERRL, EOMI, face symmetric, nml tongue movement  5/5 strength in the BL upper and lower  extremities  Sensation grossly intact in the BL upper and lower extremities  Finger-nose-finger intact BL    ED Results / Procedures / Treatments  Labs (all labs ordered are listed, but only abnormal results are displayed) Labs Reviewed  CBG MONITORING, ED - Abnormal; Notable for the following components:      Result Value   Glucose-Capillary 156 (*)    All other components within normal limits  COMPREHENSIVE METABOLIC PANEL  CBC WITH DIFFERENTIAL/PLATELET     EKG  EKG interpretation performed by myself: baseline wander, NSR, nml axis, nml intervals, no acute ischemic changes   RADIOLOGY    PROCEDURES:  Critical Care performed: No  .1-3 Lead EKG Interpretation  Performed by: Georga Hacking, MD Authorized by: Georga Hacking, MD     Interpretation: normal     ECG rate assessment: normal     Rhythm: sinus rhythm     Ectopy: none     Conduction: normal  The patient is on the cardiac monitor to evaluate for evidence of arrhythmia and/or significant heart rate changes.   MEDICATIONS ORDERED IN ED: Medications  LORazepam (ATIVAN) tablet 1 mg (1 mg Oral Given 06/08/22 1049)     IMPRESSION / MDM / ASSESSMENT AND PLAN / ED COURSE  I reviewed the triage vital signs and the nursing notes.                              Patient's presentation is most consistent with acute complicated illness / injury requiring diagnostic workup.  Differential diagnosis includes, but is not limited to, vasovagal episode, panic attack, anemia, electrolyte abnormality, hypoglycemia, AKI, dehydration  The patient is a 67 year old female presenting with lightheadedness.  Started while she was in the ED room of her mom who was then transported to the ICU.  She describes feeling lightheaded and sick with some nausea but no chest pain palpitations or dyspnea.  She does feel anxious.  Has had similar episodes like this which she says have been in the setting of hunger and she does have  history of panic attacks as well.  Patient is hypertensive on arrival but she is saturating well with normal heart rate.  She does look anxious and she is hyperventilating prefers to keep her eyes closed.  She has an intact neurologic exam lung sounds are clear.  She is not having any chest pain or dyspnea currently.  Does endorse feeling anxious.  I suspect that this is anxiety driven especially in the setting of her mom being in the hospital.  Plan to obtain EKG CBC BMP and give p.o. Ativan and reassess.   Clinical Course as of 06/08/22 1106  Tue Jun 08, 2022  1055 Glucose-Capillary(!): 156 [KM]    Clinical Course User Index [KM] Georga Hacking, MD     FINAL CLINICAL IMPRESSION(S) / ED DIAGNOSES   Final diagnoses:  None     Rx / DC Orders   ED Discharge Orders     None        Note:  This document was prepared using Dragon voice recognition software and may include unintentional dictation errors.   Georga Hacking, MD 06/08/22 [phone]

## 2022-06-08 NOTE — ED Notes (Signed)
Pt turned head and opened eyes of own accord; pt slow to answer questions and is very quiet. Pt's visitor reports mother was moved to ICU last night and this has stressed pt out.

## 2022-06-23 ENCOUNTER — Ambulatory Visit
Admission: RE | Admit: 2022-06-23 | Discharge: 2022-06-23 | Disposition: A | Discharge status: Home / Self Care | Payer: Medicare Other | Source: Ambulatory Visit | Attending: Internal Medicine | Admitting: Internal Medicine

## 2022-06-23 DIAGNOSIS — Z1231 Encounter for screening mammogram for malignant neoplasm of breast: Secondary | ICD-10-CM | POA: Diagnosis present

## 2022-07-06 ENCOUNTER — Telehealth: Payer: Self-pay

## 2022-07-06 NOTE — Telephone Encounter (Signed)
Left message for patient to call office back to schedule her annual appt 

## 2022-07-07 ENCOUNTER — Telehealth: Payer: Self-pay | Admitting: Obstetrics and Gynecology

## 2022-07-07 NOTE — Telephone Encounter (Signed)
Reached out to pt to schedule annual appt with [facility].  Left message for pt to call back.

## 2022-07-14 NOTE — Telephone Encounter (Signed)
I contacted patient via phone. I left voicemail for patient to call back to be scheduled.   

## 2022-09-14 NOTE — Telephone Encounter (Signed)
As of 09/14/2022, this pt has not contacted the office to schedule her physical.

## 2023-10-20 ENCOUNTER — Other Ambulatory Visit: Payer: Self-pay | Admitting: Internal Medicine

## 2023-10-20 DIAGNOSIS — Z1231 Encounter for screening mammogram for malignant neoplasm of breast: Secondary | ICD-10-CM

## 2023-11-16 ENCOUNTER — Ambulatory Visit
Admission: RE | Admit: 2023-11-16 | Discharge: 2023-11-16 | Disposition: A | Discharge status: Home / Self Care | Source: Ambulatory Visit | Attending: Internal Medicine | Admitting: Internal Medicine

## 2023-11-16 DIAGNOSIS — Z1231 Encounter for screening mammogram for malignant neoplasm of breast: Secondary | ICD-10-CM | POA: Insufficient documentation

## 2024-02-02 ENCOUNTER — Ambulatory Visit

## 2024-02-02 DIAGNOSIS — L578 Other skin changes due to chronic exposure to nonionizing radiation: Secondary | ICD-10-CM | POA: Diagnosis not present

## 2024-02-02 DIAGNOSIS — W908XXA Exposure to other nonionizing radiation, initial encounter: Secondary | ICD-10-CM | POA: Diagnosis not present

## 2024-02-02 DIAGNOSIS — L570 Actinic keratosis: Secondary | ICD-10-CM

## 2024-02-02 DIAGNOSIS — L814 Other melanin hyperpigmentation: Secondary | ICD-10-CM | POA: Diagnosis not present

## 2024-02-02 DIAGNOSIS — L821 Other seborrheic keratosis: Secondary | ICD-10-CM

## 2024-02-02 DIAGNOSIS — Z85828 Personal history of other malignant neoplasm of skin: Secondary | ICD-10-CM | POA: Diagnosis not present

## 2024-02-02 DIAGNOSIS — L820 Inflamed seborrheic keratosis: Secondary | ICD-10-CM | POA: Diagnosis not present

## 2024-02-02 DIAGNOSIS — C4490 Unspecified malignant neoplasm of skin, unspecified: Secondary | ICD-10-CM

## 2024-02-02 NOTE — Patient Instructions (Signed)

## 2024-02-02 NOTE — Progress Notes (Signed)
 Subjective   Megan Tapia is a 68 y.o. female who presents for the following: Lesion(s) of concern . Patient is new patient  Today patient reports: Patient states LOC on L eyebrow, been present for a couple months. She states does not cause irritation.  Review of Systems:    No other skin or systemic complaints except as noted in HPI or Assessment and Plan.  The following portions of the chart were reviewed this encounter and updated as appropriate: medications, allergies, medical history  Relevant Medical History:  Personal history of non melanoma skin cancer - see medical history for full details  (SCC) Several years removed on lip.  Objective  (SKPE) Well appearing patient in no apparent distress; mood and affect are within normal limits. Examination was performed of the: Focused Exam of: Face, hands, wrist    Examination notable for: Lentigo/lentigines: Scattered pigmented macules that are tan to brown in color and are somewhat non-uniform in shape and concentrated in the sun-exposed areas, Seborrheic Keratosis(es): Stuck-on appearing keratotic papule(s) on the trunk, some  irritated with redness, crusting, edema, and/or partial avulsion, Actinic Damage/Elastosis: chronic sun damage: dyspigmentation, telangiectasia, and wrinkling, Actinic keratosis: Scaly erythematous macule(s) concentrated on sun exposed areas   Examination limited by: Undergarments, Shoes or socks , and Clothing     Assessment & Plan  (SKAP)   BENIGN SKIN FINDINGS  - Lentigines  - Seborrheic keratoses - Reassurance provided regarding the benign appearance of lesions noted on exam today; no treatment is indicated in the absence of symptoms/changes. - Reinforced importance of photoprotective strategies including liberal and frequent sunscreen use of a broad-spectrum SPF 30 or greater, use of protective clothing, and sun avoidance for prevention of cutaneous malignancy and photoaging.  Counseled patient on the  importance of regular self-skin monitoring as well as routine clinical skin examinations as scheduled.   ACTINIC DAMAGE - Chronic condition, secondary to cumulative UV/sun exposure - Recommend daily broad spectrum sunscreen SPF 30+ to sun-exposed areas, reapply every 2 hours as needed.  - Staying in the shade or wearing long sleeves, sun glasses (UVA+UVB protection) and wide brim hats (4-inch brim around the entire circumference of the hat) are also recommended for sun protection.  - Call for new or changing lesions.  Personal history of non melanoma skin cancer  - Reviewed medical history for full details  - Reviewed sun protective measures as above - Encouraged full body skin exams    - encouraged FBSE yearly - follows w/ Dr Chrystie   Level of service outlined above   Patient instructions (SKPI)   Procedures, orders, diagnosis for this visit:  LENTIGO   SEBORRHEIC KERATOSIS   ACTINIC SKIN DAMAGE   ACTINIC KERATOSIS R wrist x 1 - Destruction of lesion - R wrist x 1 Complexity: simple   Destruction method: cryotherapy   Informed consent: discussed and consent obtained   Timeout:  patient name, date of birth, surgical site, and procedure verified Lesion destroyed using liquid nitrogen: Yes   Region frozen until ice ball extended beyond lesion: Yes   Outcome: patient tolerated procedure well with no complications    NON-MELANOMA SKIN CANCER   SEBORRHEIC KERATOSIS, INFLAMED (2) L lateral eyebrow x 1, R hand x 1 (2) - Destruction of lesion - L lateral eyebrow x 1, R hand x 1 (2) Complexity: simple   Destruction method: cryotherapy   Informed consent: discussed and consent obtained   Timeout:  patient name, date of birth, surgical site, and procedure  verified Lesion destroyed using liquid nitrogen: Yes   Region frozen until ice ball extended beyond lesion: Yes     Lentigo  Seborrheic keratosis  Actinic skin damage  Actinic keratosis -     Destruction of  lesion  Non-melanoma skin cancer  Seborrheic keratosis, inflamed -     Destruction of lesion    Return to clinic: Return if symptoms worsen or fail to improve.  I, Megan Tapia, RMA, am acting as scribe for Megan JAYSON Kanaris, MD .   Documentation: I have reviewed the above documentation for accuracy and completeness, and I agree with the above.  Megan JAYSON Kanaris, MD

## 2024-02-06 ENCOUNTER — Encounter

## 2024-03-05 ENCOUNTER — Ambulatory Visit

## 2024-03-05 DIAGNOSIS — Z83719 Family history of colon polyps, unspecified: Secondary | ICD-10-CM | POA: Diagnosis not present

## 2024-03-05 DIAGNOSIS — K5730 Diverticulosis of large intestine without perforation or abscess without bleeding: Secondary | ICD-10-CM | POA: Diagnosis not present

## 2024-03-05 DIAGNOSIS — K635 Polyp of colon: Secondary | ICD-10-CM | POA: Diagnosis not present

## 2024-03-05 DIAGNOSIS — Z1211 Encounter for screening for malignant neoplasm of colon: Secondary | ICD-10-CM | POA: Diagnosis not present

## 2024-03-05 DIAGNOSIS — K640 First degree hemorrhoids: Secondary | ICD-10-CM | POA: Diagnosis not present
